# Patient Record
Sex: Female | Born: 1965 | Race: White | Hispanic: No | Marital: Married | Smoking: Never smoker
Health system: Southern US, Community
[De-identification: ages and names within clinical notes are randomized; demographics above are authoritative.]

## PROBLEM LIST (undated history)

## (undated) DIAGNOSIS — O009 Unspecified ectopic pregnancy without intrauterine pregnancy: Secondary | ICD-10-CM

## (undated) DIAGNOSIS — K635 Polyp of colon: Secondary | ICD-10-CM

## (undated) DIAGNOSIS — J45909 Unspecified asthma, uncomplicated: Secondary | ICD-10-CM

## (undated) DIAGNOSIS — K219 Gastro-esophageal reflux disease without esophagitis: Secondary | ICD-10-CM

## (undated) DIAGNOSIS — Z8041 Family history of malignant neoplasm of ovary: Secondary | ICD-10-CM

## (undated) DIAGNOSIS — K682 Retroperitoneal fibrosis: Secondary | ICD-10-CM

## (undated) DIAGNOSIS — N809 Endometriosis, unspecified: Secondary | ICD-10-CM

## (undated) DIAGNOSIS — T783XXA Angioneurotic edema, initial encounter: Secondary | ICD-10-CM

## (undated) DIAGNOSIS — N135 Crossing vessel and stricture of ureter without hydronephrosis: Secondary | ICD-10-CM

## (undated) DIAGNOSIS — Z803 Family history of malignant neoplasm of breast: Secondary | ICD-10-CM

## (undated) DIAGNOSIS — D126 Benign neoplasm of colon, unspecified: Secondary | ICD-10-CM

## (undated) DIAGNOSIS — K589 Irritable bowel syndrome without diarrhea: Secondary | ICD-10-CM

## (undated) HISTORY — PX: OTHER SURGICAL HISTORY: SHX169

## (undated) HISTORY — DX: Polyp of colon: K63.5

## (undated) HISTORY — PX: CHOLECYSTECTOMY: SHX55

## (undated) HISTORY — DX: Angioneurotic edema, initial encounter: T78.3XXA

## (undated) HISTORY — DX: Family history of malignant neoplasm of breast: Z80.3

## (undated) HISTORY — DX: Unspecified asthma, uncomplicated: J45.909

## (undated) HISTORY — DX: Family history of malignant neoplasm of ovary: Z80.41

## (undated) HISTORY — DX: Endometriosis, unspecified: N80.9

## (undated) HISTORY — PX: ABDOMINAL HYSTERECTOMY: SHX81

## (undated) HISTORY — PX: CHOLEDOCHOJEJUNOSTOMY: SHX1344

## (undated) HISTORY — DX: Benign neoplasm of colon, unspecified: D12.6

## (undated) HISTORY — PX: TONSILLECTOMY: SUR1361

## (undated) HISTORY — DX: Irritable bowel syndrome, unspecified: K58.9

## (undated) HISTORY — DX: Retroperitoneal fibrosis: K68.2

## (undated) HISTORY — PX: POLYPECTOMY: SHX149

## (undated) HISTORY — DX: Crossing vessel and stricture of ureter without hydronephrosis: N13.5

## (undated) HISTORY — DX: Unspecified ectopic pregnancy without intrauterine pregnancy: O00.90

## (undated) HISTORY — DX: Gastro-esophageal reflux disease without esophagitis: K21.9

## (undated) HISTORY — PX: APPENDECTOMY: SHX54

---

## 2011-09-09 DIAGNOSIS — R198 Other specified symptoms and signs involving the digestive system and abdomen: Secondary | ICD-10-CM | POA: Insufficient documentation

## 2019-01-13 DIAGNOSIS — N809 Endometriosis, unspecified: Secondary | ICD-10-CM | POA: Insufficient documentation

## 2020-05-16 DIAGNOSIS — Z91018 Allergy to other foods: Secondary | ICD-10-CM | POA: Insufficient documentation

## 2021-11-04 ENCOUNTER — Telehealth: Payer: Self-pay | Admitting: Oncology

## 2021-11-04 ENCOUNTER — Encounter: Payer: Self-pay | Admitting: Hematology and Oncology

## 2021-11-04 DIAGNOSIS — C562 Malignant neoplasm of left ovary: Secondary | ICD-10-CM | POA: Insufficient documentation

## 2021-11-04 NOTE — Telephone Encounter (Signed)
Lamarr Lulas and scheduled a new patient appointment with Dr. Alvy Bimler on 11/07/2021 at 8:00 with 7:30 arrival.  Also gave her my contact info to call with any questions. ?

## 2021-11-05 ENCOUNTER — Encounter: Payer: Self-pay | Admitting: Hematology and Oncology

## 2021-11-07 ENCOUNTER — Encounter: Payer: Self-pay | Admitting: Hematology and Oncology

## 2021-11-07 ENCOUNTER — Inpatient Hospital Stay: Payer: 59 | Attending: Hematology and Oncology | Admitting: Hematology and Oncology

## 2021-11-07 ENCOUNTER — Ambulatory Visit (HOSPITAL_COMMUNITY)
Admission: RE | Admit: 2021-11-07 | Discharge: 2021-11-07 | Disposition: A | Discharge status: Home / Self Care | Payer: Commercial Managed Care - PPO | Source: Ambulatory Visit | Attending: Hematology and Oncology | Admitting: Hematology and Oncology

## 2021-11-07 ENCOUNTER — Telehealth: Payer: Self-pay | Admitting: Hematology and Oncology

## 2021-11-07 ENCOUNTER — Encounter: Payer: Self-pay | Admitting: Oncology

## 2021-11-07 ENCOUNTER — Other Ambulatory Visit: Payer: Self-pay

## 2021-11-07 VITALS — BP 120/84 | HR 99 | Temp 98.4°F | Resp 18 | Ht 63.0 in | Wt 151.4 lb

## 2021-11-07 DIAGNOSIS — Z9221 Personal history of antineoplastic chemotherapy: Secondary | ICD-10-CM | POA: Insufficient documentation

## 2021-11-07 DIAGNOSIS — Z90710 Acquired absence of both cervix and uterus: Secondary | ICD-10-CM | POA: Insufficient documentation

## 2021-11-07 DIAGNOSIS — Z803 Family history of malignant neoplasm of breast: Secondary | ICD-10-CM | POA: Insufficient documentation

## 2021-11-07 DIAGNOSIS — C562 Malignant neoplasm of left ovary: Secondary | ICD-10-CM | POA: Diagnosis present

## 2021-11-07 DIAGNOSIS — B001 Herpesviral vesicular dermatitis: Secondary | ICD-10-CM | POA: Insufficient documentation

## 2021-11-07 DIAGNOSIS — T451X5A Adverse effect of antineoplastic and immunosuppressive drugs, initial encounter: Secondary | ICD-10-CM | POA: Insufficient documentation

## 2021-11-07 DIAGNOSIS — Z79899 Other long term (current) drug therapy: Secondary | ICD-10-CM | POA: Insufficient documentation

## 2021-11-07 DIAGNOSIS — K5909 Other constipation: Secondary | ICD-10-CM | POA: Diagnosis not present

## 2021-11-07 DIAGNOSIS — G620 Drug-induced polyneuropathy: Secondary | ICD-10-CM | POA: Diagnosis not present

## 2021-11-07 DIAGNOSIS — Z8041 Family history of malignant neoplasm of ovary: Secondary | ICD-10-CM | POA: Insufficient documentation

## 2021-11-07 DIAGNOSIS — K5900 Constipation, unspecified: Secondary | ICD-10-CM | POA: Insufficient documentation

## 2021-11-07 DIAGNOSIS — Z86010 Personal history of colonic polyps: Secondary | ICD-10-CM | POA: Insufficient documentation

## 2021-11-07 DIAGNOSIS — K219 Gastro-esophageal reflux disease without esophagitis: Secondary | ICD-10-CM | POA: Insufficient documentation

## 2021-11-07 MED ORDER — ONDANSETRON HCL 8 MG PO TABS: 8.0000 mg | ORAL_TABLET | Freq: Two times a day (BID) | ORAL | 1 refills | Status: DC | PRN

## 2021-11-07 MED ORDER — PROCHLORPERAZINE MALEATE 10 MG PO TABS: 10.0000 mg | ORAL_TABLET | Freq: Four times a day (QID) | ORAL | 1 refills | Status: DC | PRN

## 2021-11-07 MED ORDER — LIDOCAINE-PRILOCAINE 2.5-2.5 % EX CREA: TOPICAL_CREAM | CUTANEOUS | 3 refills | Status: DC

## 2021-11-07 MED ORDER — DEXAMETHASONE 4 MG PO TABS: ORAL_TABLET | ORAL | 6 refills | Status: DC

## 2021-11-07 MED ORDER — VALACYCLOVIR HCL 1 G PO TABS: 1000.0000 mg | ORAL_TABLET | Freq: Two times a day (BID) | ORAL | 0 refills | Status: DC

## 2021-11-07 NOTE — Assessment & Plan Note (Signed)
She had recent severe constipation exacerbated by side effects of antiemetics ?We have extensive discussions about the role of laxatives ?

## 2021-11-07 NOTE — Assessment & Plan Note (Signed)
She was prescribed gabapentin for other reasons ?For now, she will continue gabapentin at current dose ?We discussed potential dose modification if needed in the future ?

## 2021-11-07 NOTE — Progress Notes (Signed)
Met with Kristine White and provided her with the [employer].  Encouraged her to call with any questions or needs. ?

## 2021-11-07 NOTE — Assessment & Plan Note (Signed)
She has a mild breakout with herpes simplex likely related to stress from recent treatment ?I recommend a course of Valtrex ?She is in agreement ?

## 2021-11-07 NOTE — Assessment & Plan Note (Signed)
We have long discussions about plan of care ?She had received 1 cycle of chemotherapy at [facility] complicated by neuropathy, diffuse myalgias and arthralgias, constipation, hot flashes and breakout of cold sore ?We have extensive discussions about supportive care ?I will see her prior to cycle 2 of treatment ?I will order chest x-ray for position of her port ?I will schedule chemo education class ?I have given her prescription for cranial prosthesis ?The plan would be minimum 3 cycles of adjuvant treatment, potentially 6 cycles if she tolerated treatment well ?I will also schedule her to see genetic counselor ?We will try to get information from [facility] to see if genetic testing has been ordered ?I plan to see her back again in 2 weeks for further follow-up, prior to cycle 2 of treatment ?

## 2021-11-07 NOTE — Telephone Encounter (Signed)
Scheduled appointment per 3/23 scheduling message. Called to confirm infusion appointment on 4/4. Was waiting for an addon. Patient is aware. ?

## 2021-11-07 NOTE — Progress Notes (Signed)
[place] ?CONSULT NOTE ? ?Patient Care Team: ?Yevette Edwards, MD as PCP - General (Internal Medicine) ? 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[place]) ?We have long discussions about plan of care ?She had received 1 cycle of chemotherapy at [facility] complicated by neuropathy, diffuse myalgias and arthralgias, constipation, hot flashes and breakout of cold sore ?We have extensive discussions about supportive care ?I will see her prior to cycle 2 of treatment ?I will order chest x-ray for position of her port ?I will schedule chemo education class ?I have given her prescription for cranial prosthesis ?The plan would be minimum 3 cycles of adjuvant treatment, potentially 6 cycles if she tolerated treatment well ?I will also schedule her to see genetic counselor ?We will try to get information from [facility] to see if genetic testing has been ordered ?I plan to see her back again in 2 weeks for further follow-up, prior to cycle 2 of treatment ? ?Peripheral neuropathy due to chemotherapy [facility]) ?She was prescribed gabapentin for other reasons ?For now, she will continue gabapentin at current dose ?We discussed potential dose modification if needed in the future ? ?Cold sore ?She has a mild breakout with herpes simplex likely related to stress from recent treatment ?I recommend a course of Valtrex ?She is in agreement ? ?Other constipation ?She had recent severe constipation exacerbated by side effects of antiemetics ?We have extensive discussions about the role of laxatives ? ?Orders Placed This Encounter  ?Procedures  ? DG Chest 2 View  ?  Standing Status:   Future  ?  Number of Occurrences:   1  ?  Standing Expiration Date:   11/07/2022  ?  Order Specific Question:   Reason for Exam (SYMPTOM  OR DIAGNOSIS REQUIRED)  ?  Answer:   NEED TO CONFIRM PORT LOCATION  ?  Order Specific Question:   Is patient pregnant?  ?  Answer:   No  ?  Order Specific Question:   Preferred imaging location?  ?  Answer:    [facility]  ? CBC with Differential ([place] Only)  ?  Standing Status:   Standing  ?  Number of Occurrences:   20  ?  Standing Expiration Date:   11/08/2022  ? CMP ([place] only)  ?  Standing Status:   Standing  ?  Number of Occurrences:   20  ?  Standing Expiration Date:   11/08/2022  ? ? ?The total time spent in the appointment was 80 minutes encounter with patients including review of chart and various tests results, discussions about plan of care and coordination of care plan ? ? All questions were answered. The patient knows to call the clinic with any problems, questions or concerns. No barriers to learning was detected. ? ?Heath Lark, MD ?3/23/202312:15 PM ? ?CHIEF COMPLAINTS/PURPOSE OF CONSULTATION:  ?Ovarian cancer, for adjuvant treatment ? ?HISTORY OF PRESENTING ILLNESS:  ?Kristine White 56 y.o. female is here because of recent diagnosis of ovarian cancer ?The patient was referred here from [facility] to receive chemotherapy because this location is closer to home ?She has strong family history of breast and ovarian cancer ?She had extensive surgeries in the past and was being monitored when imaging study and of last year show abnormalities leading to further work-up and eventual surgery ?Since surgery, she has normal wound healing and is doing well ?She received cycle 1 of chemotherapy with carboplatin and paclitaxel on March 15 ?Her posttreatment course was complicated by  diffuse myalgias and arthralgias, neuropathy affecting her hands and feet, breakout of herpes simplex infection at the lower lip this morning, brief episode of rash that has resolved, hot flashes and constipation that has resolved.  She was taking Zofran round-the-clock after chemotherapy and it took approximately 1 week before she had bowel movement after chemo ? ?I have reviewed her chart and materials related to her cancer extensively and collaborated history with the patient. Summary of oncologic history is as  follows: ?Oncology History Overview Note  ?Mixed endometrioid and seromucinous ?  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 She has never undergone biopsy of the retroperitoneal tissue and its been historically activated to her multiple abdominal surgeries. MRI scan on 06/2020 showed fatty infiltration of the retroperitoneal space surrounding the aorta and IVC to the bifurcation with subcentimeter retroperitoneal nodes that appeared stable to marginally improved from prior CT, consistent with retroperitoneal fibrosis. At her most recent visit in November, she reported that she was overall doing well, continues to have chronic intermittent right upper quadrant abdominal pain. She had a repeat CT scan in November 2022 which showed an abnormal appearance of the left ovary with somewhat masslike appearance and ill-defined margins. She then had a follow-up ultrasound in November 2022 that showed a moderately lobulated mass within the left adnexa that was solid with mild peripheral and central vascularity, measuring about 4 cm, O-RADS 4, no findings of ascites. She has a history of endometriosis (s/p LSC ovarian cystectomies for several endometriomas and is s/p [facility] hysterectomy). ?  ?07/09/2021 Imaging  ? Ct abdomen and pelvis ?Abnormal appearance of the left ovary with somewhat mass like appearance and ill defined margins, possibly neoplasm.  Recommend pelvic ultrasound for further evaluation.  ? ?Infiltrative changes of the retroperitoneum, similar compared with previous examination, consistent with known retroperitoneal fibrosis   ?  ?09/19/2021 Pathology Results  ? A. Ovary and fallopian tube, left, salpingo-oophorectomy ?- Endometrioid adenocarcinoma, seromucinous type, FIGO grade 1, present as multiple foci of invasive carcinoma arising in a background of serousmucinous borderline tumor, see comment and synoptic  report ?-Ovarian surface involvement is identified ?-Left fallopian tube with focal endometriosis and focal foreign body giant cell reaction consistent with prior surgical intervention, no involvement by tumor identified ?  ?B.  Ovary and Fallopian tube, right, salpingo-oophorectomy ?-Serous cystadenoma, 1.7 cm, right ovary  ?-Background ovary with focal endometriosis and surface adhesions ?-Right fallopian tube with serosal adhesions ?-No involvement by tumor is identified ?  ?C.  Omental biopsy ?-Benign adipose tissue, no involvement by tumor identified ?  ?09/19/2021 Surgery  ? Procedures: Robotic assisted bilateral salpingo-oophorectomy (CPT [id number]), omental biopsy, bilateral ureterolysis (CPT [phone]), lysis of adhesions ? ?Surgeon: Cindie Laroche, MD ? ?Assistants: Bernadene Bell, MD - Fellow, Deeann Cree, MD - Resident ?Findings: On entry to abdomen, dense adhesions of the omentum to the anterior abdominal wall, unable to visualize upper abdomen, specifically unable to visualize the diaphragm, liver, stomach. Omentum grossly normal apart from adhesions. Entry site inspected with no trauma to surrounding structures. Bilateral complex ovarian masses, left approximately 4cm and right 3cm. Bilateral ovarian masses densely adherent to the pelvic sidewalls, and the right mass adherent to the mesentery of the rectum. Uterus and cervix surgically absent. Retroperitoneal fibrosis bilaterally requiring ureterolysis from the pelvic brim to the insertion into the bladder bilaterally. IOFS c/w borderline tumor.  ? ?Specimens:  ?ID Type Source Tests Collected by Time  Destination  ?1 : left tube and ovary Tissue Ovary, Left SURGICAL PATHOLOGY EXAM Bernadene Bell, MD 09/19/2021 [phone]  ?2 : pelvic washing Washing Pelvic cavity CYTOLOGY - EXAM FROM OPTIME Christella Hartigan, MD 09/19/2021 [phone]  ?3 : right ovary and tube Tissue Ovary, Right SURGICAL PATHOLOGY EXAM Christella Hartigan, MD 09/19/2021 1012  ?4 : omental biopsy Tissue  Omentum SURGICAL PATHOLOGY EXAM Christella Hartigan, MD 09/19/2021 1012  ?  ?10/29/2021 Procedure  ? Successful placement of a single lumen Power port catheter in the right internal jugular vein under ultrasound and

## 2021-11-07 NOTE — Progress Notes (Signed)
START ON PATHWAY REGIMEN - Ovarian ? ? ?  A cycle is every 21 days: ?    Paclitaxel  ?    Carboplatin  ? ?**Always confirm dose/schedule in your pharmacy ordering system** ? ?Patient Characteristics: ?Postoperative without Neoadjuvant Therapy (Pathologic Staging), Newly Diagnosed, Adjuvant Therapy, Stage IC, Grade 1 or 2 ?BRCA Mutation Status: Awaiting Test Results ?Therapeutic Status: Postoperative without Neoadjuvant Therapy (Pathologic Staging) ?AJCC 8 Stage Grouping: IC ?AJCC M Category: cM0 ?AJCC T Category: pT1c ?AJCC N Category: pN0 ?Tumor Grade: 1 ?Intent of Therapy: ?Curative Intent, Discussed with Patient ?

## 2021-11-08 ENCOUNTER — Telehealth: Payer: Self-pay | Admitting: Oncology

## 2021-11-08 NOTE — Telephone Encounter (Signed)
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  She has never taken Heparin and was told not to take it due to Alpha Gal. ?

## 2021-11-11 ENCOUNTER — Telehealth: Payer: Self-pay | Admitting: Oncology

## 2021-11-11 ENCOUNTER — Encounter: Payer: Self-pay | Admitting: Hematology and Oncology

## 2021-11-11 ENCOUNTER — Inpatient Hospital Stay: Payer: 59

## 2021-11-11 ENCOUNTER — Other Ambulatory Visit: Payer: Self-pay | Admitting: Hematology and Oncology

## 2021-11-11 DIAGNOSIS — R21 Rash and other nonspecific skin eruption: Secondary | ICD-10-CM | POA: Insufficient documentation

## 2021-11-11 MED ORDER — PREDNISONE 20 MG PO TABS: 40.0000 mg | ORAL_TABLET | Freq: Every day | ORAL | 0 refills | Status: DC

## 2021-11-11 NOTE — Telephone Encounter (Signed)
Called Kristine White and advised her of note from Dr. Alvy Bimler.  Scheduled her for tomorrow at 9:20.   ? ?Also discussed using prednisone.  She said she does not have any on hand but usually uses Allegra and Pepcid BID.  Sometimes she will use a prednisone taper but does not have it on hand.  [facility] [address] in [place].   ?

## 2021-11-11 NOTE — Telephone Encounter (Signed)
Left a message advising that [facility]. ?

## 2021-11-11 NOTE — Telephone Encounter (Signed)
Kristine White called and rescheduled her chemo education to Thursday because she is not feeling well and has no energy today.  She said she has a raised rash that started yesterday.  The rash is on her feet, legs, arms and buttocks.  It is not on her trunk or face.  She said it is itchy on her feet and knees.  Her thighs have some scabbed areas.  It is hard from her to walk with the rash on her feet.  She is wondering if this is from chemotherapy - last treatment was 10/30/21 at [facility] or she also has a history of angioedema.  She is going to try to send pictures through [place]. ?

## 2021-11-11 NOTE — Telephone Encounter (Signed)
[facility]

## 2021-11-11 NOTE — Telephone Encounter (Signed)
This is not typical side-effects from chemo or angioedema ?I have no opening to see her today but I can try to add her on tomorrow at 1pm ?

## 2021-11-12 ENCOUNTER — Other Ambulatory Visit: Payer: Self-pay

## 2021-11-12 ENCOUNTER — Inpatient Hospital Stay (HOSPITAL_BASED_OUTPATIENT_CLINIC_OR_DEPARTMENT_OTHER): Payer: 59 | Admitting: Hematology and Oncology

## 2021-11-12 ENCOUNTER — Encounter: Payer: Self-pay | Admitting: Hematology and Oncology

## 2021-11-12 DIAGNOSIS — G620 Drug-induced polyneuropathy: Secondary | ICD-10-CM | POA: Diagnosis not present

## 2021-11-12 DIAGNOSIS — Z90710 Acquired absence of both cervix and uterus: Secondary | ICD-10-CM | POA: Diagnosis not present

## 2021-11-12 DIAGNOSIS — T451X5A Adverse effect of antineoplastic and immunosuppressive drugs, initial encounter: Secondary | ICD-10-CM | POA: Diagnosis not present

## 2021-11-12 DIAGNOSIS — Z79899 Other long term (current) drug therapy: Secondary | ICD-10-CM | POA: Diagnosis not present

## 2021-11-12 DIAGNOSIS — C562 Malignant neoplasm of left ovary: Secondary | ICD-10-CM

## 2021-11-12 DIAGNOSIS — Z803 Family history of malignant neoplasm of breast: Secondary | ICD-10-CM | POA: Diagnosis not present

## 2021-11-12 DIAGNOSIS — Z9221 Personal history of antineoplastic chemotherapy: Secondary | ICD-10-CM | POA: Diagnosis not present

## 2021-11-12 DIAGNOSIS — R21 Rash and other nonspecific skin eruption: Secondary | ICD-10-CM

## 2021-11-12 DIAGNOSIS — Z8041 Family history of malignant neoplasm of ovary: Secondary | ICD-10-CM | POA: Diagnosis not present

## 2021-11-12 DIAGNOSIS — B001 Herpesviral vesicular dermatitis: Secondary | ICD-10-CM | POA: Diagnosis not present

## 2021-11-12 DIAGNOSIS — K5900 Constipation, unspecified: Secondary | ICD-10-CM | POA: Diagnosis not present

## 2021-11-12 DIAGNOSIS — Z86010 Personal history of colonic polyps: Secondary | ICD-10-CM | POA: Diagnosis not present

## 2021-11-12 DIAGNOSIS — K219 Gastro-esophageal reflux disease without esophagitis: Secondary | ICD-10-CM | POA: Diagnosis not present

## 2021-11-12 NOTE — Progress Notes (Signed)
Pharmacist Chemotherapy Monitoring - Initial Assessment   ? ?Anticipated start date: 11/19/21  ? ?The following has been reviewed per standard work regarding the patient's treatment regimen: ?The patient's diagnosis, treatment plan and drug doses, and organ/hematologic function ?Lab orders and baseline tests specific to treatment regimen  ?The treatment plan start date, drug sequencing, and pre-medications ?Prior authorization status  ?Patient's documented medication list, including drug-drug interaction screen and prescriptions for anti-emetics and supportive care specific to the treatment regimen ?The drug concentrations, fluid compatibility, administration routes, and timing of the medications to be used ?The patient's access for treatment and lifetime cumulative dose history, if applicable  ?The patient's medication allergies and previous infusion related reactions, if applicable  ? ?Changes made to treatment plan:  ?N/A ? ?Follow up needed:  ?Pending authorization for treatment  ? ? ?Romualdo Bolk [place], [place], ?11/12/2021  11:14 AM  ?

## 2021-11-12 NOTE — Assessment & Plan Note (Signed)
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

## 2021-11-12 NOTE — Assessment & Plan Note (Signed)
She has developed some hives not related to chemotherapy ?We discussed the role of genetics referral ?

## 2021-11-12 NOTE — Progress Notes (Signed)
[place] ?OFFICE PROGRESS NOTE ? ?Patient Care Team: ?Kristine Edwards, MD as PCP - General (Internal Medicine) ? 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 ?Cold sore ?This is resolved ?We will discontinue Valtrex ? 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 ?No orders of the defined types were placed in this encounter. ? ? ?All questions were answered. The patient knows to call the clinic with any problems, questions or concerns. ?The total time spent in the appointment was 20 minutes encounter with patients including review of chart and various tests results, discussions about plan of care and coordination of care plan ?  ?Heath Lark, MD ?11/12/2021 10:06 AM ? ?INTERVAL HISTORY: ?Please see below for problem oriented charting. ?she returns for urgent evaluation due to hives ?The hives is throughout except for her torso ?No skin peeling or blister formation ?She felt slightly better since we started her on prednisone ? ?REVIEW OF SYSTEMS:   ?Constitutional: Denies fevers, chills or abnormal weight loss ?Eyes: Denies blurriness of vision ?Ears, nose, mouth, throat, and face: Denies mucositis or sore throat ?Respiratory: Denies cough, dyspnea or wheezes ?Cardiovascular: Denies palpitation, chest discomfort or lower extremity swelling ?Gastrointestinal:  Denies nausea, heartburn or change in bowel habits ?Lymphatics: Denies new lymphadenopathy or easy  bruising ?Neurological:Denies numbness, tingling or new weaknesses ?Behavioral/Psych: Mood is stable, no new changes  ?All other systems were reviewed with the patient and are negative. ? ?I have reviewed the past medical history, past surgical history, social history and family history with the patient and they are unchanged from previous note. ? ?ALLERGIES:  is allergic to heparin. ? ?MEDICATIONS:  ?Current Outpatient Medications  ?Medication Sig Dispense Refill  ? acetaminophen (TYLENOL) 325 MG tablet Take 325 mg by mouth every 6 (six) hours as needed.    ? Cholecalciferol 50 MCG (2000 UT) CAPS Take 2,000 Units by mouth daily.    ? dexamethasone (DECADRON) 4 MG tablet Take 2 tabs at the night before and 2 tab the morning of chemotherapy, every 3 weeks, by mouth x 5 cycles 20 tablet 6  ? EPINEPHrine 0.3 mg/0.3 mL IJ SOAJ injection Inject into the muscle.    ? gabapentin (NEURONTIN) 100 MG capsule Take by mouth.    ? lidocaine-prilocaine (EMLA) cream Apply topically.    ? ondansetron (ZOFRAN) 8 MG tablet Take 1 tablet (8 mg total) by mouth 2 (two) times daily as needed for refractory nausea / vomiting. Start on day 3 after carboplatin chemo. 30 tablet 1  ? predniSONE (DELTASONE) 20 MG tablet Take 2 tablets (40 mg total) by mouth daily with breakfast. 14 tablet 0  ? prochlorperazine (COMPAZINE) 10 MG tablet Take 1 tablet (10 mg total) by mouth every 6 (six) hours as needed (Nausea or vomiting). 30 tablet 1  ? ?No current facility-administered medications for this visit.  ? ? ?SUMMARY OF ONCOLOGIC HISTORY: ?Oncology History Overview Note  ?Mixed endometrioid and seromucinous ?  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 She has never undergone biopsy of the retroperitoneal tissue and its been historically activated to her multiple abdominal surgeries. MRI scan on 06/2020 showed fatty infiltration of the  retroperitoneal space surrounding the aorta and IVC to the bifurcation with subcentimeter retroperitoneal nodes that appeared stable to marginally improved from prior CT, consistent with retroperitoneal fibrosis. At her most recent visit in November, she reported that she was overall doing well, continues to have chronic intermittent right upper quadrant abdominal pain. She had a repeat CT scan in November 2022 which showed an abnormal appearance of the left ovary with somewhat masslike appearance and ill-defined margins. She then had a follow-up ultrasound in November 2022 that showed a moderately lobulated mass within the left adnexa that was solid with mild peripheral and central vascularity, measuring about 4 cm, O-RADS 4, no findings of ascites. She has a history of endometriosis (s/p LSC ovarian cystectomies for several endometriomas and is s/p [facility] hysterectomy). ?  ?07/09/2021 Imaging  ? Ct abdomen and pelvis ?Abnormal appearance of the left ovary with somewhat mass like appearance and ill defined margins, possibly neoplasm.  Recommend pelvic ultrasound for further evaluation.  ? ?Infiltrative changes of the retroperitoneum, similar compared with previous examination, consistent with known retroperitoneal fibrosis   ?  ?09/19/2021 Pathology Results  ? A. Ovary and fallopian tube, left, salpingo-oophorectomy ?- Endometrioid adenocarcinoma, seromucinous type, FIGO grade 1, present as multiple foci of invasive carcinoma arising in a background of serousmucinous borderline tumor, see comment and synoptic report ?-Ovarian surface involvement is identified ?-Left fallopian tube with focal endometriosis and focal foreign body giant cell reaction consistent with prior surgical intervention, no involvement by tumor identified ?  ?B.  Ovary and Fallopian tube, right, salpingo-oophorectomy ?-Serous cystadenoma, 1.7 cm, right ovary  ?-Background ovary with focal endometriosis and surface adhesions ?-Right fallopian tube  with serosal adhesions ?-No involvement by tumor is identified ?  ?C.  Omental biopsy ?-Benign adipose tissue, no involvement by tumor identified ?  ?09/19/2021 Surgery  ? Procedures: Robotic assisted bilateral salpingo-oophorectomy (CPT P4008117), omental biopsy, bilateral ureterolysis (CPT [phone]), lysis of adhesions ? ?Surgeon: Cindie Laroche, MD ? ?Assistants: Bernadene Bell, MD - Fellow, Deeann Cree, MD - Resident ?Findings: On entry to abdomen, dense adhesions of the omentum to the anterior abdominal wall, unable to visualize upper abdomen, specifically unable to visualize the diaphragm, liver, stomach. Omentum grossly normal apart from adhesions. Entry site inspected with no trauma to surrounding structures. Bilateral complex ovarian masses, left approximately 4cm and right 3cm. Bilateral ovarian masses densely adherent to the pelvic sidewalls, and the right mass adherent to the mesentery of the rectum. Uterus and cervix surgically absent. Retroperitoneal fibrosis bilaterally requiring ureterolysis from the pelvic brim to the insertion into the bladder bilaterally. IOFS c/w borderline tumor.  ? ?Specimens:  ?ID Type Source Tests Collected by Time Destination  ?1 : left tube and ovary Tissue Ovary, Left SURGICAL PATHOLOGY EXAM Bernadene Bell, MD 09/19/2021 [phone]  ?2 : pelvic washing Washing Pelvic cavity CYTOLOGY - EXAM FROM OPTIME Christella Hartigan, MD 09/19/2021 [phone]  ?3 : right ovary and tube Tissue Ovary, Right SURGICAL PATHOLOGY EXAM Christella Hartigan, MD 09/19/2021 1012  ?4 : omental biopsy Tissue Omentum SURGICAL PATHOLOGY EXAM Christella Hartigan, MD 09/19/2021 1012  ?  ?10/29/2021 Procedure  ? Successful placement of a single lumen Power port catheter in the right internal jugular vein under ultrasound and fluoroscopy at [facility] ?  ?10/30/2021 -  Chemotherapy  ?  She received first cycle of carboplatin and taxol at [facility] ?  ?11/04/2021 Initial Diagnosis  ? Left ovarian epithelial cancer [facility]) ?  ?11/04/2021 Cancer  Staging  ? Staging form: Ovary, Fallopian Tube, and Primary Peritoneal Carcinoma, AJCC 8th Edition ?- Pathologic stage from 11/04/2021: FIGO Stage IC2, calculated as Stage IC (pT1c2, pN0, cM0) - Signed by Heath Lark, MD on 11/04/2021 ?S

## 2021-11-12 NOTE — Assessment & Plan Note (Signed)
This is resolved ?We will discontinue Valtrex ?

## 2021-11-14 ENCOUNTER — Inpatient Hospital Stay: Payer: 59

## 2021-11-14 ENCOUNTER — Other Ambulatory Visit: Payer: Self-pay

## 2021-11-18 ENCOUNTER — Encounter: Payer: Self-pay | Admitting: Hematology and Oncology

## 2021-11-18 NOTE — Progress Notes (Signed)
Called pt to introduce myself as her Financial Resource Specialist and to discuss the [employer] grant.  Pt has 2 insurances so copay assistance shouldn't be needed.  I left a msg requesting she return my call if she's interested in applying for the grant.  

## 2021-11-19 ENCOUNTER — Inpatient Hospital Stay: Payer: 59 | Attending: Hematology and Oncology

## 2021-11-19 ENCOUNTER — Encounter: Payer: Self-pay | Admitting: Hematology and Oncology

## 2021-11-19 ENCOUNTER — Inpatient Hospital Stay: Payer: 59

## 2021-11-19 ENCOUNTER — Other Ambulatory Visit: Payer: Self-pay

## 2021-11-19 ENCOUNTER — Inpatient Hospital Stay (HOSPITAL_BASED_OUTPATIENT_CLINIC_OR_DEPARTMENT_OTHER): Payer: 59 | Admitting: Hematology and Oncology

## 2021-11-19 VITALS — BP 109/76 | HR 65 | Temp 98.4°F | Resp 18

## 2021-11-19 DIAGNOSIS — C562 Malignant neoplasm of left ovary: Secondary | ICD-10-CM | POA: Insufficient documentation

## 2021-11-19 DIAGNOSIS — Z5111 Encounter for antineoplastic chemotherapy: Secondary | ICD-10-CM | POA: Diagnosis not present

## 2021-11-19 DIAGNOSIS — G620 Drug-induced polyneuropathy: Secondary | ICD-10-CM

## 2021-11-19 DIAGNOSIS — Z7952 Long term (current) use of systemic steroids: Secondary | ICD-10-CM | POA: Insufficient documentation

## 2021-11-19 DIAGNOSIS — T451X5A Adverse effect of antineoplastic and immunosuppressive drugs, initial encounter: Secondary | ICD-10-CM

## 2021-11-19 DIAGNOSIS — K5909 Other constipation: Secondary | ICD-10-CM | POA: Insufficient documentation

## 2021-11-19 LAB — CBC WITH DIFFERENTIAL (CANCER CENTER ONLY)
Abs Immature Granulocytes: 0.07 10*3/uL (ref 0.00–0.07)
Basophils Absolute: 0 10*3/uL (ref 0.0–0.1)
Basophils Relative: 0 %
Eosinophils Absolute: 0 10*3/uL (ref 0.0–0.5)
Eosinophils Relative: 0 %
HCT: 36.6 % (ref 36.0–46.0)
Hemoglobin: 12.1 g/dL (ref 12.0–15.0)
Immature Granulocytes: 1 %
Lymphocytes Relative: 10 %
Lymphs Abs: 0.9 10*3/uL (ref 0.7–4.0)
MCH: 28.9 pg (ref 26.0–34.0)
MCHC: 33.1 g/dL (ref 30.0–36.0)
MCV: 87.4 fL (ref 80.0–100.0)
Monocytes Absolute: 0 10*3/uL — ABNORMAL LOW (ref 0.1–1.0)
Monocytes Relative: 0 %
Neutro Abs: 8 10*3/uL — ABNORMAL HIGH (ref 1.7–7.7)
Neutrophils Relative %: 89 %
Platelet Count: 210 10*3/uL (ref 150–400)
RBC: 4.19 MIL/uL (ref 3.87–5.11)
RDW: 13.1 % (ref 11.5–15.5)
WBC Count: 8.9 10*3/uL (ref 4.0–10.5)
nRBC: 0 % (ref 0.0–0.2)

## 2021-11-19 LAB — CMP (CANCER CENTER ONLY)
ALT: 54 U/L — ABNORMAL HIGH (ref 0–44)
AST: 21 U/L (ref 15–41)
Albumin: 4.4 g/dL (ref 3.5–5.0)
Alkaline Phosphatase: 75 U/L (ref 38–126)
Anion gap: 10 (ref 5–15)
BUN: 17 mg/dL (ref 6–20)
CO2: 22 mmol/L (ref 22–32)
Calcium: 9.2 mg/dL (ref 8.9–10.3)
Chloride: 108 mmol/L (ref 98–111)
Creatinine: 0.8 mg/dL (ref 0.44–1.00)
GFR, Estimated: >60 mL/min (ref >60)
Glucose, Bld: 214 mg/dL — ABNORMAL HIGH (ref 70–99)
Potassium: 4.1 mmol/L (ref 3.5–5.1)
Sodium: 140 mmol/L (ref 135–145)
Total Bilirubin: 0.3 mg/dL (ref 0.3–1.2)
Total Protein: 7.2 g/dL (ref 6.5–8.1)

## 2021-11-19 MED ORDER — SODIUM CHLORIDE 0.9 % IV SOLN
175.0000 mg/m2 | Freq: Once | INTRAVENOUS | Status: AC
  Administered 2021-11-19: 306 mg via INTRAVENOUS
  Filled 2021-11-19: qty 51

## 2021-11-19 MED ORDER — SODIUM CHLORIDE 0.9% FLUSH
10.0000 mL | INTRAVENOUS | Status: DC | PRN
  Administered 2021-11-19: 10 mL

## 2021-11-19 MED ORDER — FAMOTIDINE IN NACL 20-0.9 MG/50ML-% IV SOLN
20.0000 mg | Freq: Once | INTRAVENOUS | Status: AC
  Administered 2021-11-19: 20 mg via INTRAVENOUS
  Filled 2021-11-19: qty 50

## 2021-11-19 MED ORDER — SODIUM CHLORIDE 0.9 % IV SOLN: Freq: Once | INTRAVENOUS | Status: AC

## 2021-11-19 MED ORDER — PALONOSETRON HCL INJECTION 0.25 MG/5ML
0.2500 mg | Freq: Once | INTRAVENOUS | Status: AC
  Administered 2021-11-19: 0.25 mg via INTRAVENOUS
  Filled 2021-11-19: qty 5

## 2021-11-19 MED ORDER — SODIUM CHLORIDE 0.9 % IV SOLN
690.0000 mg | Freq: Once | INTRAVENOUS | Status: AC
  Administered 2021-11-19: 690 mg via INTRAVENOUS
  Filled 2021-11-19: qty 69

## 2021-11-19 MED ORDER — DIPHENHYDRAMINE HCL 50 MG/ML IJ SOLN
25.0000 mg | Freq: Once | INTRAMUSCULAR | Status: AC
  Administered 2021-11-19: 25 mg via INTRAVENOUS
  Filled 2021-11-19: qty 1

## 2021-11-19 MED ORDER — SODIUM CHLORIDE 0.9 % IV SOLN
150.0000 mg | Freq: Once | INTRAVENOUS | Status: AC
  Administered 2021-11-19: 150 mg via INTRAVENOUS
  Filled 2021-11-19: qty 150

## 2021-11-19 MED ORDER — SODIUM CHLORIDE 0.9% FLUSH
10.0000 mL | Freq: Once | INTRAVENOUS | Status: AC
  Administered 2021-11-19: 10 mL

## 2021-11-19 MED ORDER — SODIUM CHLORIDE 0.9 % IV SOLN
10.0000 mg | Freq: Once | INTRAVENOUS | Status: AC
  Administered 2021-11-19: 10 mg via INTRAVENOUS
  Filled 2021-11-19: qty 10

## 2021-11-19 NOTE — Assessment & Plan Note (Signed)
We discussed importance of laxative therapy 

## 2021-11-19 NOTE — Assessment & Plan Note (Signed)
This is not severe ?We will observe closely ?We discussed importance of trial of ice/cryotherapy during treatment ?

## 2021-11-19 NOTE — Patient Instructions (Signed)
[facility] MEDICAL [facility]COLOG[facility]  Discharge Instructions: Thank you for choosing [facility] to provide your oncology and hematology care.   If you have a lab appointment with the Cancer Center, please go directly to the Cancer Center and check in at the registration area.   Wear comfortable clothing and clothing appropriate for easy access to any Portacath or PICC line.   We strive to give you quality time with your provider. You may need to reschedule your appointment if you arrive late (15 or more minutes).  Arriving late affects you and other patients whose appointments are after yours.  Also, if you miss three or more appointments without notifying the office, you may be dismissed from the clinic at the provider's discretion.      For prescription refill requests, have your pharmacy contact our office and allow 72 hours for refills to be completed.    Today you received the following chemotherapy and/or immunotherapy agents: Taxol & Carboplatin   To help prevent nausea and vomiting after your treatment, we encourage you to take your nausea medication as directed.  BELOW ARE SYMPTOMS THAT SHOULD BE REPORTED IMMEDIATELY: *FEVER GREATER THAN 100.4 F (38 C) OR HIGHER *CHILLS OR SWEATING *NAUSEA AND VOMITING THAT IS NOT CONTROLLED WITH YOUR NAUSEA MEDICATION *UNUSUAL SHORTNESS OF BREATH *UNUSUAL BRUISING OR BLEEDING *URINARY PROBLEMS (pain or burning when urinating, or frequent urination) *BOWEL PROBLEMS (unusual diarrhea, constipation, pain near the anus) TENDERNESS IN MOUTH AND THROAT WITH OR WITHOUT PRESENCE OF ULCERS (sore throat, sores in mouth, or a toothache) UNUSUAL RASH, SWELLING OR PAIN  UNUSUAL VAGINAL DISCHARGE OR ITCHING   Items with * indicate a potential emergency and should be followed up as soon as possible or go to the Emergency Department if any problems should occur.  Please show the CHEMOTHERAPY ALERT CARD or IMMUNOTHERAPY ALERT CARD at  check-in to the Emergency Department and triage nurse.  Should you have questions after your visit or need to cancel or reschedule your appointment, please contact [facility]  Dept: [phone]  and follow the prompts.  Office hours are 8:00 a.m. to 4:30 p.m. Monday - Friday. Please note that voicemails left after 4:00 p.m. may not be returned until the following business day.  We are closed weekends and major holidays. You have access to a nurse at all times for urgent questions. Please call the main number to the clinic Dept: [phone] and follow the prompts.   For any non-urgent questions, you may also contact your provider using MyChart. We now offer e-Visits for anyone 18 and older to request care online for non-urgent symptoms. For details visit [email or web address].   Also download the MyChart app! Go to the app store, search "MyChart", open the app, select [facility], and log in with your MyChart username and password.  Due to Covid, a mask is required upon entering the hospital/clinic. If you do not have a mask, one will be given to you upon arrival. For doctor visits, patients may have 1 support person aged 18 or older with them. For treatment visits, patients cannot have anyone with them due to current Covid guidelines and our immunocompromised population.   

## 2021-11-19 NOTE — Assessment & Plan Note (Signed)
She has developed some hives not related to chemotherapy ?We discussed the role of genetics referral ?We will proceed with treatment as scheduled ?We discussed the role of cryotherapy and the importance of laxative to avoid constipation ?

## 2021-11-19 NOTE — Progress Notes (Signed)
[facility] ?OFFICE PROGRESS NOTE ? ?Patient Care Team: ?Yevette Edwards, MD as PCP - General (Internal Medicine) ? 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 ?Peripheral neuropathy due to chemotherapy [facility]) ?This is not severe ?We will observe closely ?We discussed importance of trial of ice/cryotherapy during treatment ? ?Other constipation ?We discussed importance of laxative therapy ? ?No orders of the defined types were placed in this encounter. ? ? ?All questions were answered. The patient knows to call the clinic with any problems, questions or concerns. ?The total time spent in the appointment was 25 minutes encounter with patients including review of chart and various tests results, discussions about plan of care and coordination of care plan ?  ?Heath Lark, MD ?11/19/2021 2:55 PM ? ?INTERVAL HISTORY: ?Please see below for problem oriented charting. ?she returns for treatment follow-up seen prior to cycle 2 of treatment ?Her hives has resolved ?She denies further constipation ?She has minimal residual neuropathy from treatment ? ?REVIEW OF SYSTEMS:   ?Constitutional: Denies fevers, chills or abnormal weight loss ?Eyes: Denies blurriness of vision ?Ears, nose, mouth, throat, and face: Denies mucositis or sore throat ?Respiratory: Denies cough, dyspnea or wheezes ?Cardiovascular: Denies palpitation, chest discomfort or lower extremity swelling ?Gastrointestinal:  Denies nausea, heartburn or change in bowel habits ?Skin: Denies abnormal skin rashes ?Lymphatics: Denies new lymphadenopathy or easy bruising ?Behavioral/Psych: Mood is stable, no new changes  ?All other systems were reviewed with the patient and are negative. ? ?I have reviewed the  past medical history, past surgical history, social history and family history with the patient and they are unchanged from previous note. ? ?ALLERGIES:  is allergic to heparin. ? ?MEDICATIONS:  ?Current Outpatient Medications  ?Medication Sig Dispense Refill  ? acetaminophen (TYLENOL) 325 MG tablet Take 325 mg by mouth every 6 (six) hours as needed.    ? Cholecalciferol 50 MCG (2000 UT) CAPS Take 2,000 Units by mouth daily.    ? dexamethasone (DECADRON) 4 MG tablet Take 2 tabs at the night before and 2 tab the morning of chemotherapy, every 3 weeks, by mouth x 5 cycles 20 tablet 6  ? EPINEPHrine 0.3 mg/0.3 mL IJ SOAJ injection Inject into the muscle.    ? gabapentin (NEURONTIN) 100 MG capsule Take by mouth.    ? lidocaine-prilocaine (EMLA) cream Apply topically.    ? ondansetron (ZOFRAN) 8 MG tablet Take 1 tablet (8 mg total) by mouth 2 (two) times daily as needed for refractory nausea / vomiting. Start on day 3 after carboplatin chemo. 30 tablet 1  ? prochlorperazine (COMPAZINE) 10 MG tablet Take 1 tablet (10 mg total) by mouth every 6 (six) hours as needed (Nausea or vomiting). 30 tablet 1  ? ?No current facility-administered medications for this visit.  ? ?Facility-Administered Medications Ordered in Other Visits  ?Medication Dose Route Frequency Provider Last Rate Last Admin  ? CARBOplatin (PARAPLATIN) 690 mg in sodium chloride 0.9 % 250 mL chemo infusion  690 mg Intravenous Once Alvy Bimler, Kenna Seward, MD      ? sodium chloride flush (NS) 0.9 % injection 10 mL  10 mL Intracatheter PRN Heath Lark, MD      ? ? ?SUMMARY OF ONCOLOGIC HISTORY: ?Oncology History Overview Note  ?Mixed endometrioid and seromucinous ?  ?  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 She has never undergone biopsy of the retroperitoneal tissue and its been historically activated to her multiple abdominal surgeries. MRI scan on 06/2020 showed fatty  infiltration of the retroperitoneal space surrounding the aorta and IVC to the bifurcation with subcentimeter retroperitoneal nodes that appeared stable to marginally improved from prior CT, consistent with retroperitoneal fibrosis. At her most recent visit in November, she reported that she was overall doing well, continues to have chronic intermittent right upper quadrant abdominal pain. She had a repeat CT scan in November 2022 which showed an abnormal appearance of the left ovary with somewhat masslike appearance and ill-defined margins. She then had a follow-up ultrasound in November 2022 that showed a moderately lobulated mass within the left adnexa that was solid with mild peripheral and central vascularity, measuring about 4 cm, O-RADS 4, no findings of ascites. She has a history of endometriosis (s/p LSC ovarian cystectomies for several endometriomas and is s/p [facility] hysterectomy). ?  ?07/09/2021 Imaging  ? Ct abdomen and pelvis ?Abnormal appearance of the left ovary with somewhat mass like appearance and ill defined margins, possibly neoplasm.  Recommend pelvic ultrasound for further evaluation.  ? ?Infiltrative changes of the retroperitoneum, similar compared with previous examination, consistent with known retroperitoneal fibrosis   ?  ?09/19/2021 Pathology Results  ? A. Ovary and fallopian tube, left, salpingo-oophorectomy ?- Endometrioid adenocarcinoma, seromucinous type, FIGO grade 1, present as multiple foci of invasive carcinoma arising in a background of serousmucinous borderline tumor, see comment and synoptic report ?-Ovarian surface involvement is identified ?-Left fallopian tube with focal endometriosis and focal foreign body giant cell reaction consistent with prior surgical intervention, no involvement by tumor identified ?  ?B.  Ovary and Fallopian tube, right, salpingo-oophorectomy ?-Serous cystadenoma, 1.7 cm, right ovary  ?-Background ovary with focal endometriosis and surface  adhesions ?-Right fallopian tube with serosal adhesions ?-No involvement by tumor is identified ?  ?C.  Omental biopsy ?-Benign adipose tissue, no involvement by tumor identified ?  ?09/19/2021 Surgery  ? Procedures: Robotic assisted bilateral salpingo-oophorectomy (CPT [id number]), omental biopsy, bilateral ureterolysis (CPT [phone]), lysis of adhesions ? ?Surgeon: Cindie Laroche, MD ? ?Assistants: Bernadene Bell, MD - Fellow, Deeann Cree, MD - Resident ?Findings: On entry to abdomen, dense adhesions of the omentum to the anterior abdominal wall, unable to visualize upper abdomen, specifically unable to visualize the diaphragm, liver, stomach. Omentum grossly normal apart from adhesions. Entry site inspected with no trauma to surrounding structures. Bilateral complex ovarian masses, left approximately 4cm and right 3cm. Bilateral ovarian masses densely adherent to the pelvic sidewalls, and the right mass adherent to the mesentery of the rectum. Uterus and cervix surgically absent. Retroperitoneal fibrosis bilaterally requiring ureterolysis from the pelvic brim to the insertion into the bladder bilaterally. IOFS c/w borderline tumor.  ? ?Specimens:  ?ID Type Source Tests Collected by Time Destination  ?1 : left tube and ovary Tissue Ovary, Left SURGICAL PATHOLOGY EXAM Bernadene Bell, MD 09/19/2021 [phone]  ?2 : pelvic washing Washing Pelvic cavity CYTOLOGY - EXAM FROM OPTIME Christella Hartigan, MD 09/19/2021 [phone]  ?3 : right ovary and tube Tissue Ovary, Right SURGICAL PATHOLOGY EXAM Christella Hartigan, MD 09/19/2021 1012  ?4 : omental biopsy Tissue Omentum SURGICAL PATHOLOGY EXAM Christella Hartigan, MD 09/19/2021 1012  ?  ?10/29/2021 Procedure  ? Successful placement of a single lumen Power port catheter in the right internal  jugular vein under ultrasound and fluoroscopy at [facility] ?  ?10/30/2021 -  Chemotherapy  ? She received first cycle of carboplatin and taxol at [facility] ?  ?11/04/2021 Initial Diagnosis  ? Left ovarian epithelial  cancer [facility]) ?  ?11/04/2021 Cancer Staging  ? Staging form: Ovary, Fallopian Tube, and Primary Peritoneal Carcinoma, AJCC 8th Edition ?- Pathologic stage from 11/04/2021: FIGO Stage IC2, calculated as Stage IC (pT1c2, pN0,

## 2021-11-20 ENCOUNTER — Telehealth: Payer: Self-pay | Admitting: *Deleted

## 2021-11-20 NOTE — Telephone Encounter (Signed)
-----   Message from Anastasia Pall, RN sent at 11/19/2021  3:43 PM EDT ----- ?Regarding: 1st time Taxol/Carbo @ [facility], being seen by Dr. Alvy Bimler ?Patient received [facility]. This was her 1st time receiving the infusion here at the [facility] and was treated as a first time. Please check on her tomorrow, thank you.  ? ?

## 2021-11-20 NOTE — Telephone Encounter (Signed)
LM on pt's VM to call back to let us know how she did with her treatment.  ?

## 2021-11-21 ENCOUNTER — Other Ambulatory Visit: Payer: Self-pay | Admitting: Hematology and Oncology

## 2021-11-21 ENCOUNTER — Telehealth: Payer: Self-pay | Admitting: Oncology

## 2021-11-21 MED ORDER — GABAPENTIN 100 MG PO CAPS: 200.0000 mg | ORAL_CAPSULE | Freq: Two times a day (BID) | ORAL | 11 refills | Status: DC

## 2021-11-21 NOTE — Telephone Encounter (Signed)
Kristine White called and asked about gabapentin.  She said she had discussed increasing the tablet dose with Dr. Alvy Bimler at her visit this week.  She has not had to take it for the past 4-5 days but is now having burning again her hands and feet.  She is wondering if she should refill the 100 mg tablets from her original prescription or have Dr. Alvy Bimler send in a prescription to [facility] on [address]. ?

## 2021-11-21 NOTE — Telephone Encounter (Signed)
Let Carlis know the prescription has been sent to [facility]. ?

## 2021-11-21 NOTE — Telephone Encounter (Signed)
Called Cindy back and she would like to try the 200 mg BID. ?

## 2021-11-21 NOTE — Telephone Encounter (Signed)
done

## 2021-11-21 NOTE — Telephone Encounter (Signed)
I raised concerns the dose was too small ?My typical starting dose is 300 mg BID ?Does she wants to try that or continue 100 mg pills? How about 100 mg BID or 200 mg BID? ?

## 2021-11-24 ENCOUNTER — Encounter: Payer: Self-pay | Admitting: Hematology and Oncology

## 2021-11-26 ENCOUNTER — Encounter: Payer: Self-pay | Admitting: Hematology and Oncology

## 2021-12-09 ENCOUNTER — Other Ambulatory Visit: Payer: Self-pay | Admitting: Genetic Counselor

## 2021-12-09 DIAGNOSIS — C562 Malignant neoplasm of left ovary: Secondary | ICD-10-CM

## 2021-12-10 ENCOUNTER — Telehealth: Payer: Self-pay | Admitting: Pharmacist

## 2021-12-10 ENCOUNTER — Telehealth: Payer: Self-pay | Admitting: Oncology

## 2021-12-10 ENCOUNTER — Inpatient Hospital Stay: Payer: 59

## 2021-12-10 ENCOUNTER — Encounter: Payer: Self-pay | Admitting: Hematology and Oncology

## 2021-12-10 ENCOUNTER — Other Ambulatory Visit: Payer: Self-pay

## 2021-12-10 ENCOUNTER — Other Ambulatory Visit (HOSPITAL_COMMUNITY): Payer: Self-pay

## 2021-12-10 ENCOUNTER — Inpatient Hospital Stay (HOSPITAL_BASED_OUTPATIENT_CLINIC_OR_DEPARTMENT_OTHER): Payer: 59 | Admitting: Hematology and Oncology

## 2021-12-10 DIAGNOSIS — C562 Malignant neoplasm of left ovary: Secondary | ICD-10-CM | POA: Diagnosis not present

## 2021-12-10 DIAGNOSIS — M898X9 Other specified disorders of bone, unspecified site: Secondary | ICD-10-CM | POA: Diagnosis not present

## 2021-12-10 DIAGNOSIS — K5909 Other constipation: Secondary | ICD-10-CM

## 2021-12-10 DIAGNOSIS — T451X5A Adverse effect of antineoplastic and immunosuppressive drugs, initial encounter: Secondary | ICD-10-CM

## 2021-12-10 DIAGNOSIS — G620 Drug-induced polyneuropathy: Secondary | ICD-10-CM

## 2021-12-10 DIAGNOSIS — K2900 Acute gastritis without bleeding: Secondary | ICD-10-CM

## 2021-12-10 DIAGNOSIS — K2970 Gastritis, unspecified, without bleeding: Secondary | ICD-10-CM | POA: Insufficient documentation

## 2021-12-10 LAB — CMP (CANCER CENTER ONLY)
ALT: 37 U/L (ref 0–44)
AST: 17 U/L (ref 15–41)
Albumin: 4.4 g/dL (ref 3.5–5.0)
Alkaline Phosphatase: 75 U/L (ref 38–126)
Anion gap: 7 (ref 5–15)
BUN: 15 mg/dL (ref 6–20)
CO2: 23 mmol/L (ref 22–32)
Calcium: 9.2 mg/dL (ref 8.9–10.3)
Chloride: 109 mmol/L (ref 98–111)
Creatinine: 0.76 mg/dL (ref 0.44–1.00)
GFR, Estimated: >60 mL/min (ref >60)
Glucose, Bld: 173 mg/dL — ABNORMAL HIGH (ref 70–99)
Potassium: 4 mmol/L (ref 3.5–5.1)
Sodium: 139 mmol/L (ref 135–145)
Total Bilirubin: 0.3 mg/dL (ref 0.3–1.2)
Total Protein: 6.9 g/dL (ref 6.5–8.1)

## 2021-12-10 LAB — CBC WITH DIFFERENTIAL (CANCER CENTER ONLY)
Abs Immature Granulocytes: 0.08 10*3/uL — ABNORMAL HIGH (ref 0.00–0.07)
Basophils Absolute: 0 10*3/uL (ref 0.0–0.1)
Basophils Relative: 0 %
Eosinophils Absolute: 0 10*3/uL (ref 0.0–0.5)
Eosinophils Relative: 0 %
HCT: 33.3 % — ABNORMAL LOW (ref 36.0–46.0)
Hemoglobin: 11.1 g/dL — ABNORMAL LOW (ref 12.0–15.0)
Immature Granulocytes: 1 %
Lymphocytes Relative: 9 %
Lymphs Abs: 0.7 10*3/uL (ref 0.7–4.0)
MCH: 28.9 pg (ref 26.0–34.0)
MCHC: 33.3 g/dL (ref 30.0–36.0)
MCV: 86.7 fL (ref 80.0–100.0)
Monocytes Absolute: 0.1 10*3/uL (ref 0.1–1.0)
Monocytes Relative: 1 %
Neutro Abs: 6.2 10*3/uL (ref 1.7–7.7)
Neutrophils Relative %: 89 %
Platelet Count: 179 10*3/uL (ref 150–400)
RBC: 3.84 MIL/uL — ABNORMAL LOW (ref 3.87–5.11)
RDW: 14.6 % (ref 11.5–15.5)
WBC Count: 7 10*3/uL (ref 4.0–10.5)
nRBC: 0 % (ref 0.0–0.2)

## 2021-12-10 LAB — GENETIC SCREENING ORDER

## 2021-12-10 MED ORDER — SODIUM CHLORIDE 0.9 % IV SOLN
686.9640 mg | Freq: Once | INTRAVENOUS | Status: AC
  Administered 2021-12-10: 690 mg via INTRAVENOUS
  Filled 2021-12-10: qty 69

## 2021-12-10 MED ORDER — SODIUM CHLORIDE 0.9 % IV SOLN
10.0000 mg | Freq: Once | INTRAVENOUS | Status: AC
  Administered 2021-12-10: 10 mg via INTRAVENOUS
  Filled 2021-12-10: qty 10

## 2021-12-10 MED ORDER — OXYCODONE HCL 5 MG PO TABS
5.0000 mg | ORAL_TABLET | ORAL | 0 refills | Status: DC | PRN
  Filled 2021-12-10: qty 30, 5d supply, fill #0

## 2021-12-10 MED ORDER — SODIUM CHLORIDE 0.9% FLUSH
10.0000 mL | INTRAVENOUS | Status: DC | PRN
  Administered 2021-12-10: 10 mL

## 2021-12-10 MED ORDER — PANTOPRAZOLE SODIUM 40 MG PO TBEC
40.0000 mg | DELAYED_RELEASE_TABLET | Freq: Every day | ORAL | 3 refills | Status: DC
  Filled 2021-12-10: qty 30, 30d supply, fill #0

## 2021-12-10 MED ORDER — DIPHENHYDRAMINE HCL 50 MG/ML IJ SOLN
25.0000 mg | Freq: Once | INTRAMUSCULAR | Status: AC
  Administered 2021-12-10: 25 mg via INTRAVENOUS
  Filled 2021-12-10: qty 1

## 2021-12-10 MED ORDER — FAMOTIDINE IN NACL 20-0.9 MG/50ML-% IV SOLN
20.0000 mg | Freq: Once | INTRAVENOUS | Status: AC
  Administered 2021-12-10: 20 mg via INTRAVENOUS
  Filled 2021-12-10: qty 50

## 2021-12-10 MED ORDER — SODIUM CHLORIDE 0.9 % IV SOLN
140.0000 mg/m2 | Freq: Once | INTRAVENOUS | Status: AC
  Administered 2021-12-10: 246 mg via INTRAVENOUS
  Filled 2021-12-10: qty 41

## 2021-12-10 MED ORDER — SODIUM CHLORIDE 0.9 % IV SOLN
175.0000 mg/m2 | Freq: Once | INTRAVENOUS | Status: DC
  Filled 2021-12-10: qty 51

## 2021-12-10 MED ORDER — SODIUM CHLORIDE 0.9% FLUSH
10.0000 mL | Freq: Once | INTRAVENOUS | Status: AC
  Administered 2021-12-10: 10 mL

## 2021-12-10 MED ORDER — PALONOSETRON HCL INJECTION 0.25 MG/5ML
0.2500 mg | Freq: Once | INTRAVENOUS | Status: AC
  Administered 2021-12-10: 0.25 mg via INTRAVENOUS
  Filled 2021-12-10: qty 5

## 2021-12-10 MED ORDER — SODIUM CHLORIDE 0.9 % IV SOLN
150.0000 mg | Freq: Once | INTRAVENOUS | Status: AC
  Administered 2021-12-10: 150 mg via INTRAVENOUS
  Filled 2021-12-10: qty 150

## 2021-12-10 MED ORDER — DEXAMETHASONE 4 MG PO TABS: ORAL_TABLET | ORAL | 6 refills | Status: DC

## 2021-12-10 MED ORDER — SODIUM CHLORIDE 0.9 % IV SOLN: Freq: Once | INTRAVENOUS | Status: AC

## 2021-12-10 MED ORDER — FAMOTIDINE 20 MG PO TABS: 20.0000 mg | ORAL_TABLET | Freq: Every day | ORAL | Status: AC

## 2021-12-10 NOTE — Progress Notes (Signed)
[facility] ?OFFICE PROGRESS NOTE ? ?Patient Care Team: ?Yevette Edwards, MD as PCP - General (Internal Medicine) ? 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 ?Peripheral neuropathy due to chemotherapy [facility]) ?she has mild peripheral neuropathy, likely related to side effects of treatment. ?I plan to reduce the dose of treatment as outlined above.  ?I explained to the patient the rationale of this strategy and reassured the patient it would not compromise the efficacy of treatment ?She will continue gabapentin as needed ?She will continue cryotherapy during infusional Taxol ? ?Other constipation ?I warned her about risk of constipation with chemotherapy and with pain medicine ? ?Bone pain ?She has diffuse bone pain with each cycle of treatment ?Acetaminophen is not enough ?I recommend taking pain medicine as needed  ?I recommend her to take laxatives while on pain medicine ? ?Gastritis ?She has severe gastritis with each cycle of treatment ?I recommend reducing premed dexamethasone for future cycle ?I also recommend pantoprazole in the morning and Pepcid as needed in the evening ?I also recommended patient to take Tums as needed ? ?No orders of the defined types were placed in this encounter. ? ? ?All questions were answered. The patient knows to call the clinic with any problems, questions or concerns. ?The total time spent in the appointment was 40 minutes encounter  with patients including review of chart and various tests results, discussions about plan of care and coordination of care plan ?  ?Heath Lark, MD ?12/10/2021 10:55 AM ? ?INTERVAL HISTORY: ?Please see below for problem oriented charting. ?she returns for treatment follow-up prior to cycle 3 of chemotherapy ?She shared with me she had an appointment and follow-up at [facility] with [facility] imaging next month ?She has noted intermittent neuropathy, slightly better compared to her experience after cycle 1 ?She is only taking gabapentin at nighttime ?She denies severe constipation with this cycle of treatment ?She have severe and diffuse bone pain within a few days after chemotherapy and requested narcotic prescription ?She also have severe gastritis within a few days after treatment ? ?REVIEW OF SYSTEMS:   ?Constitutional: Denies fevers, chills or abnormal weight loss ?Eyes: Denies blurriness of vision ?Ears, nose, mouth, throat, and face: Denies mucositis or sore throat ?Respiratory: Denies cough, dyspnea or wheezes ?Cardiovascular: Denies palpitation, chest discomfort or lower extremity swelling ?Skin: Denies abnormal skin rashes ?Lymphatics: Denies new lymphadenopathy or easy bruising ?Behavioral/Psych: Mood is stable, no new changes  ?All other systems were reviewed with the patient and are negative. ? ?I have reviewed the past medical history, past surgical history, social history and family history with the patient and they are unchanged from previous note. ? ?ALLERGIES:  is allergic to heparin. ? ?MEDICATIONS:  ?Current Outpatient Medications  ?Medication Sig Dispense Refill  ? famotidine (PEPCID) 20 MG tablet Take 1 tablet (20 mg total) by mouth at bedtime.    ? oxyCODONE (OXY IR/ROXICODONE) 5 MG immediate release tablet Take 1 tablet by mouth every 4 hours as  needed for severe pain. 30 tablet 0  ? pantoprazole (PROTONIX) 40 MG tablet Take 1 tablet by mouth daily. 30 tablet 3  ? acetaminophen (TYLENOL) 325 MG tablet Take  325 mg by mouth every 6 (six) hours as needed.    ? Cholecalciferol 50 MCG (2000 UT) CAPS Take 2,000 Units by mouth daily.    ? dexamethasone (DECADRON) 4 MG tablet Take 2 tabs at the night before chemotherapy, every 3 weeks, by mouth 20 tablet 6  ? EPINEPHrine 0.3 mg/0.3 mL IJ SOAJ injection Inject into the muscle.    ? gabapentin (NEURONTIN) 100 MG capsule Take 2 capsules (200 mg total) by mouth 2 (two) times daily. 120 capsule 11  ? lidocaine-prilocaine (EMLA) cream Apply topically.    ? ondansetron (ZOFRAN) 8 MG tablet Take 1 tablet (8 mg total) by mouth 2 (two) times daily as needed for refractory nausea / vomiting. Start on day 3 after carboplatin chemo. 30 tablet 1  ? prochlorperazine (COMPAZINE) 10 MG tablet Take 1 tablet (10 mg total) by mouth every 6 (six) hours as needed (Nausea or vomiting). 30 tablet 1  ? ?No current facility-administered medications for this visit.  ? ?Facility-Administered Medications Ordered in Other Visits  ?Medication Dose Route Frequency Provider Last Rate Last Admin  ? CARBOplatin (PARAPLATIN) 690 mg in sodium chloride 0.9 % 250 mL chemo infusion  690 mg Intravenous Once Alvy Bimler, Skii Cleland, MD      ? fosaprepitant (EMEND) 150 mg in sodium chloride 0.9 % 145 mL IVPB  150 mg Intravenous Once Alvy Bimler, Isacc Turney, MD 450 mL/hr at 12/10/21 1044 150 mg at 12/10/21 1044  ? PACLitaxel (TAXOL) 306 mg in sodium chloride 0.9 % 500 mL chemo infusion (> '80mg'$ /m2)  175 mg/m2 (Order-Specific) Intravenous Once Alvy Bimler, Eutha Cude, MD      ? sodium chloride flush (NS) 0.9 % injection 10 mL  10 mL Intracatheter PRN Heath Lark, MD      ? ? ?SUMMARY OF ONCOLOGIC HISTORY: ?Oncology History Overview Note  ?Mixed endometrioid and seromucinous ?  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 She has never undergone biopsy of the retroperitoneal tissue and its been historically activated to her multiple abdominal surgeries.  MRI scan on 06/2020 showed fatty infiltration of the retroperitoneal space surrounding the aorta and IVC to the bifurcation with subcentimeter retroperitoneal nodes that appeared stable to marginally improved from prior CT, consistent with retroperitoneal fibrosis. At her most recent visit in November, she reported that she was overall doing well, continues to have chronic intermittent right upper quadrant abdominal pain. She had a repeat CT scan in November 2022 which showed an abnormal appearance of the left ovary with somewhat masslike appearance and ill-defined margins. She then had a follow-up ultrasound in November 2022 that showed a moderately lobulated mass within the left adnexa that was solid with mild peripheral and central vascularity, measuring about 4 cm, O-RADS 4, no findings of ascites. She has a history of endometriosis (s/p LSC ovarian cystectomies for several endometriomas and is s/p [facility] hysterectomy). ?  ?07/09/2021 Imaging  ? Ct abdomen and pelvis ?Abnormal appearance of the left ovary with somewhat mass like appearance and ill defined margins, possibly neoplasm.  Recommend pelvic ultrasound for further evaluation.  ? ?Infiltrative changes of the retroperitoneum, similar compared with previous examination, consistent with known retroperitoneal fibrosis   ?  ?09/19/2021 Pathology Results  ? A. Ovary and fallopian tube, left, salpingo-oophorectomy ?-  Endometrioid adenocarcinoma, seromucinous type, FIGO grade 1, present as multiple foci of invasive carcinoma arising in a background of serousmucinous borderline tumor, see comment and synoptic report ?-Ovarian surface involvement is identified ?-Left fallopian tube with focal endometriosis and focal foreign body giant cell reaction consistent with prior surgical intervention, no involvement by tumor identified ?  ?B.  Ovary and Fallopian tube, right, salpingo-oophorectomy ?-Serous cystadenoma, 1.7 cm, right ovary  ?-Background ovary with focal  endometriosis and surface adhesions ?-Right fallopian tube with serosal adhesions ?-No involvement by tumor is identified ?  ?C.  Omental biopsy ?-Benign adipose tissue, no involvement by tumor identified ?  ?2/2/

## 2021-12-10 NOTE — Assessment & Plan Note (Signed)
She has diffuse bone pain with each cycle of treatment ?Acetaminophen is not enough ?I recommend taking pain medicine as needed  ?I recommend her to take laxatives while on pain medicine ?

## 2021-12-10 NOTE — Assessment & Plan Note (Signed)
she has mild peripheral neuropathy, likely related to side effects of treatment. ?I plan to reduce the dose of treatment as outlined above.  ?I explained to the patient the rationale of this strategy and reassured the patient it would not compromise the efficacy of treatment ?She will continue gabapentin as needed ?She will continue cryotherapy during infusional Taxol ?

## 2021-12-10 NOTE — Assessment & Plan Note (Signed)
She has severe gastritis with each cycle of treatment ?I recommend reducing premed dexamethasone for future cycle ?I also recommend pantoprazole in the morning and Pepcid as needed in the evening ?I also recommended patient to take Tums as needed ?

## 2021-12-10 NOTE — Patient Instructions (Signed)
[facility]  Discharge Instructions: ?Thank you for choosing [facility] to provide your oncology and hematology care.  ? ?If you have a lab appointment with the [facility], please go directly to the [facility] and check in at the registration area. ?  ?Wear comfortable clothing and clothing appropriate for easy access to any Portacath or PICC line.  ? ?We strive to give you quality time with your provider. You may need to reschedule your appointment if you arrive late (15 or more minutes).  Arriving late affects you and other patients whose appointments are after yours.  Also, if you miss three or more appointments without notifying the office, you may be dismissed from the clinic at the provider?s discretion.    ?  ?For prescription refill requests, have your pharmacy contact our office and allow 72 hours for refills to be completed.   ? ?Today you received the following chemotherapy and/or immunotherapy agents Taxol, Carboplatin    ?  ?To help prevent nausea and vomiting after your treatment, we encourage you to take your nausea medication as directed. ? ?BELOW ARE SYMPTOMS THAT SHOULD BE REPORTED IMMEDIATELY: ?*FEVER GREATER THAN 100.4 F (38 ?C) OR HIGHER ?*CHILLS OR SWEATING ?*NAUSEA AND VOMITING THAT IS NOT CONTROLLED WITH YOUR NAUSEA MEDICATION ?*UNUSUAL SHORTNESS OF BREATH ?*UNUSUAL BRUISING OR BLEEDING ?*URINARY PROBLEMS (pain or burning when urinating, or frequent urination) ?*BOWEL PROBLEMS (unusual diarrhea, constipation, pain near the anus) ?TENDERNESS IN MOUTH AND THROAT WITH OR WITHOUT PRESENCE OF ULCERS (sore throat, sores in mouth, or a toothache) ?UNUSUAL RASH, SWELLING OR PAIN  ?UNUSUAL VAGINAL DISCHARGE OR ITCHING  ? ?Items with * indicate a potential emergency and should be followed up as soon as possible or go to the Emergency Department if any problems should occur. ? ?Please show the CHEMOTHERAPY ALERT CARD or IMMUNOTHERAPY ALERT CARD at  check-in to the Emergency Department and triage nurse. ? ?Should you have questions after your visit or need to cancel or reschedule your appointment, please contact [facility]  Dept: [phone]  and follow the prompts.  Office hours are 8:00 a.m. to 4:30 p.m. Monday - Friday. Please note that voicemails left after 4:00 p.m. may not be returned until the following business day.  We are closed weekends and major holidays. You have access to a nurse at all times for urgent questions. Please call the main number to the clinic Dept: [phone] and follow the prompts. ? ? ?For any non-urgent questions, you may also contact your provider using MyChart. We now offer e-Visits for anyone 76 and older to request care online for non-urgent symptoms. For details visit [email or web address]. ?  ?Also download the MyChart app! Go to the app store, search "MyChart", open the app, select [facility], and log in with your MyChart username and password. ? ?Due to Covid, a mask is required upon entering the hospital/clinic. If you do not have a mask, one will be given to you upon arrival. For doctor visits, patients may have 1 support person aged 74 or older with them. For treatment visits, patients cannot have anyone with them due to current Covid guidelines and our immunocompromised population.  ? ?Paclitaxel injection ?What is this medication? ?PACLITAXEL (PAK li TAX el) is a chemotherapy drug. It targets fast dividing cells, like cancer cells, and causes these cells to die. This medicine is used to treat ovarian cancer, breast cancer, lung cancer, Kaposi's sarcoma, and other cancers. ?This medicine  may be used for other purposes; ask your health care provider or pharmacist if you have questions. ?COMMON BRAND NAME(S): Onxol, Taxol ?What should I tell my care team before I take this medication? ?They need to know if you have any of these conditions: ?history of irregular heartbeat ?liver  disease ?low blood counts, like low white cell, platelet, or red cell counts ?lung or breathing disease, like asthma ?tingling of the fingers or toes, or other nerve disorder ?an unusual or allergic reaction to paclitaxel, alcohol, polyoxyethylated castor oil, other chemotherapy, other medicines, foods, dyes, or preservatives ?pregnant or trying to get pregnant ?breast-feeding ?How should I use this medication? ?This drug is given as an infusion into a vein. It is administered in a hospital or clinic by a specially trained health care professional. ?Talk to your pediatrician regarding the use of this medicine in children. Special care may be needed. ?Overdosage: If you think you have taken too much of this medicine contact a poison control center or emergency room at once. ?NOTE: This medicine is only for you. Do not share this medicine with others. ?What if I miss a dose? ?It is important not to miss your dose. Call your doctor or health care professional if you are unable to keep an appointment. ?What may interact with this medication? ?Do not take this medicine with any of the following medications: ?live virus vaccines ?This medicine may also interact with the following medications: ?antiviral medicines for hepatitis, HIV or AIDS ?certain antibiotics like erythromycin and clarithromycin ?certain medicines for fungal infections like ketoconazole and itraconazole ?certain medicines for seizures like carbamazepine, phenobarbital, phenytoin ?gemfibrozil ?nefazodone ?rifampin ?St. John's wort ?This list may not describe all possible interactions. Give your health care provider a list of all the medicines, herbs, non-prescription drugs, or dietary supplements you use. Also tell them if you smoke, drink alcohol, or use illegal drugs. Some items may interact with your medicine. ?What should I watch for while using this medication? ?Your condition will be monitored carefully while you are receiving this medicine. You  will need important blood work done while you are taking this medicine. ?This medicine can cause serious allergic reactions. To reduce your risk you will need to take other medicine(s) before treatment with this medicine. If you experience allergic reactions like skin rash, itching or hives, swelling of the face, lips, or tongue, tell your doctor or health care professional right away. ?In some cases, you may be given additional medicines to help with side effects. Follow all directions for their use. ?This drug may make you feel generally unwell. This is not uncommon, as chemotherapy can affect healthy cells as well as cancer cells. Report any side effects. Continue your course of treatment even though you feel ill unless your doctor tells you to stop. ?Call your doctor or health care professional for advice if you get a fever, chills or sore throat, or other symptoms of a cold or flu. Do not treat yourself. This drug decreases your body's ability to fight infections. Try to avoid being around people who are sick. ?This medicine may increase your risk to bruise or bleed. Call your doctor or health care professional if you notice any unusual bleeding. ?Be careful brushing and flossing your teeth or using a toothpick because you may get an infection or bleed more easily. If you have any dental work done, tell your dentist you are receiving this medicine. ?Avoid taking products that contain aspirin, acetaminophen, ibuprofen, naproxen, or ketoprofen unless instructed by  your doctor. These medicines may hide a fever. ?Do not become pregnant while taking this medicine. Women should inform their doctor if they wish to become pregnant or think they might be pregnant. There is a potential for serious side effects to an unborn child. Talk to your health care professional or pharmacist for more information. Do not breast-feed an infant while taking this medicine. ?Men are advised not to father a child while receiving this  medicine. ?This product may contain alcohol. Ask your pharmacist or healthcare provider if this medicine contains alcohol. Be sure to tell all healthcare providers you are taking this medicine. Certain medicines, like

## 2021-12-10 NOTE — Telephone Encounter (Signed)
Scheduled appointment per 04/25 los. Patient aware.  ?

## 2021-12-10 NOTE — Telephone Encounter (Signed)
Left a message advising genetic counseling appointment for tomorrow has been changed to a MyChart visit. ?

## 2021-12-10 NOTE — Assessment & Plan Note (Signed)
I warned her about risk of constipation with chemotherapy and with pain medicine ?

## 2021-12-10 NOTE — Assessment & Plan Note (Signed)
She had major side effects from treatment with severe bone pain requiring narcotics, intermittent but persistent neuropathy and others ?After a lot of discussion, we are in agreement to proceed with chemotherapy with minor dose adjustment of Taxol ?I will also prescribe pain medicine for the patient to take for few days after chemotherapy for severe bone pain ?We also discussed follow-up ?In my opinion, she would not need interim imaging study now and I would prefer to do imaging study after completion of chemotherapy ?

## 2021-12-11 ENCOUNTER — Inpatient Hospital Stay (HOSPITAL_BASED_OUTPATIENT_CLINIC_OR_DEPARTMENT_OTHER): Payer: 59 | Admitting: Genetic Counselor

## 2021-12-11 ENCOUNTER — Encounter: Payer: Self-pay | Admitting: Genetic Counselor

## 2021-12-11 DIAGNOSIS — Z8041 Family history of malignant neoplasm of ovary: Secondary | ICD-10-CM

## 2021-12-11 DIAGNOSIS — C562 Malignant neoplasm of left ovary: Secondary | ICD-10-CM | POA: Diagnosis not present

## 2021-12-11 DIAGNOSIS — Z803 Family history of malignant neoplasm of breast: Secondary | ICD-10-CM | POA: Diagnosis not present

## 2021-12-11 DIAGNOSIS — D126 Benign neoplasm of colon, unspecified: Secondary | ICD-10-CM | POA: Diagnosis not present

## 2021-12-11 NOTE — Progress Notes (Addendum)
REFERRING PROVIDER: ?Heath Lark, MD ?[address] ?[place],  [place] [address] ? ?PRIMARY PROVIDER:  ?Yevette Edwards, MD ? ?PRIMARY REASON FOR VISIT:  ?1. Family history of breast cancer   ?2. Family history of ovarian cancer   ?3. 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 ? ? ?HISTORY OF PRESENT ILLNESS:  I connected with  Kristine White on 12/11/2021 at 10 AM EDT by MyChart video conference and verified that I am speaking with the correct person using two identifiers.  ? ?Patient location: Home ?Provider location: [facility] [place]  ? ?Kristine White, a 56 y.o. female, was seen for a [place] [facility] cancer genetics consultation at the request of Dr. Alvy Bimler due to a personal and family history of ovarian cancer and FH of breast cancer.  Kristine White presents to clinic today to discuss the possibility of a hereditary predisposition to cancer, genetic testing, and to further clarify her future cancer risks, as well as potential cancer risks for family members.  ? ?In November 2022, at the age of 74, Kristine White was diagnosed with cancer of the left ovary. The treatment plan chemotherapy and surgery.  The patient reports that when she was 3 she had uncontrolled rectal bleeding which lead to a diagnosis of juvenile polyposis syndrome.  ?   ? ?CANCER HISTORY:  ?Oncology History Overview Note  ?Mixed endometrioid and seromucinous ?  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 She has never undergone biopsy of the retroperitoneal tissue and its been historically activated to her multiple abdominal surgeries. MRI scan on 06/2020 showed fatty infiltration of the retroperitoneal space surrounding the aorta and IVC to the bifurcation with subcentimeter retroperitoneal nodes that appeared stable to marginally improved from prior CT, consistent with retroperitoneal fibrosis. At her  most recent visit in November, she reported that she was overall doing well, continues to have chronic intermittent right upper quadrant abdominal pain. She had a repeat CT scan in November 2022 which showed an abnormal appearance of the left ovary with somewhat masslike appearance and ill-defined margins. She then had a follow-up ultrasound in November 2022 that showed a moderately lobulated mass within the left adnexa that was solid with mild peripheral and central vascularity, measuring about 4 cm, O-RADS 4, no findings of ascites. She has a history of endometriosis (s/p [facility]SC ovarian cystectomies for several endometriomas and is s/p [facility] hysterectomy). ?  ?07/09/2021 Imaging  ? Ct abdomen and pelvis ?Abnormal appearance of the left ovary with somewhat mass like appearance and ill defined margins, possibly neoplasm.  Recommend pelvic ultrasound for further evaluation.  ? ?Infiltrative changes of the retroperitoneum, similar compared with previous examination, consistent with known retroperitoneal fibrosis   ?  ?09/19/2021 Pathology Results  ? A. Ovary and fallopian tube, left, salpingo-oophorectomy ?- Endometrioid adenocarcinoma, seromucinous type, FIGO grade 1, present as multiple foci of invasive carcinoma arising in a background of serousmucinous borderline tumor, see comment and synoptic report ?-Ovarian surface involvement is identified ?-Left fallopian tube with focal endometriosis and focal foreign body giant cell reaction consistent with prior surgical intervention, no involvement by tumor identified ?  ?B.  Ovary and Fallopian tube, right, salpingo-oophorectomy ?-Serous cystadenoma, 1.7 cm, right ovary  ?-Background ovary with focal endometriosis and surface adhesions ?-Right fallopian tube with serosal adhesions ?-No involvement by tumor is identified ?  ?C.  Omental biopsy ?-Benign adipose tissue,  no involvement by tumor identified ?  ?09/19/2021 Surgery  ? Procedures: Robotic assisted bilateral  salpingo-oophorectomy (CPT [id number]), omental biopsy, bilateral ureterolysis (CPT [phone]), lysis of adhesions ? ?Surgeon: Cindie Laroche, MD ? ?Assistants: Bernadene Bell, MD - Fellow, Deeann Cree, MD - Resident ?Findings: On entry to abdomen, dense adhesions of the omentum to the anterior abdominal wall, unable to visualize upper abdomen, specifically unable to visualize the diaphragm, liver, stomach. Omentum grossly normal apart from adhesions. Entry site inspected with no trauma to surrounding structures. Bilateral complex ovarian masses, left approximately 4cm and right 3cm. Bilateral ovarian masses densely adherent to the pelvic sidewalls, and the right mass adherent to the mesentery of the rectum. Uterus and cervix surgically absent. Retroperitoneal fibrosis bilaterally requiring ureterolysis from the pelvic brim to the insertion into the bladder bilaterally. IOFS c/w borderline tumor.  ? ?Specimens:  ?ID Type Source Tests Collected by Time Destination  ?1 : left tube and ovary Tissue Ovary, Left SURGICAL PATHOLOGY EXAM Bernadene Bell, MD 09/19/2021 [phone]  ?2 : pelvic washing Washing Pelvic cavity CYTOLOGY - EXAM FROM OPTIME Christella Hartigan, MD 09/19/2021 [phone]  ?3 : right ovary and tube Tissue Ovary, Right SURGICAL PATHOLOGY EXAM Christella Hartigan, MD 09/19/2021 1012  ?4 : omental biopsy Tissue Omentum SURGICAL PATHOLOGY EXAM Christella Hartigan, MD 09/19/2021 1012  ?  ?10/29/2021 Procedure  ? Successful placement of a single lumen Power port catheter in the right internal jugular vein under ultrasound and fluoroscopy at [facility] ?  ?10/30/2021 -  Chemotherapy  ? She received first cycle of carboplatin and taxol at [facility] ?  ?11/04/2021 Initial Diagnosis  ? Left ovarian epithelial cancer [facility]) ?  ?11/04/2021 Cancer Staging  ? Staging form: Ovary, Fallopian Tube, and Primary Peritoneal Carcinoma, AJCC 8th Edition ?- Pathologic stage from 11/04/2021: FIGO Stage IC2, calculated as Stage IC (pT1c2, pN0, cM0) - Signed by Heath Lark, MD on 11/04/2021 ?Stage prefix: Initial diagnosis ? ?  ?11/11/2021 Imaging  ? 1. Right-sided venous Port-A-Cath positioning, as described above. ?2. No acute or active cardiopulmonary disease. ?  ?11/19/2021 -  Chemotherapy  ? Patient is on Treatment Plan : OVARIAN Carboplatin (AUC 6) / Paclitaxel (175) q21d x 6 cycles  ? ?  ?  ? ? ? ?RISK FACTORS:  ?Menarche was at age 68.  ?First live birth at age 35.  ?Ovaries intact: no.  ?Hysterectomy: yes.  ?Menopausal status: postmenopausal.  ?HRT use: 0 years. ?Colonoscopy: yes; abnormal. ?Mammogram within the last year: yes. ?Number of breast biopsies:  10+ biopsies . ?Up to date with pelvic exams: yes. ?Any excessive radiation exposure in the past: no ? ?Past Medical History:  ?Diagnosis Date  ? Asthma   ? Endometriosis   ? Family history of breast cancer   ? Family history of ovarian cancer   ? GERD (gastroesophageal reflux disease)   ? IBS (irritable bowel syndrome)   ? Idiopathic angioedema   ? Juvenile polyp of colon   ? Juvenile polyposis syndrome   ? Retroperitoneal fibrosis   ? ? ?Past Surgical History:  ?Procedure Laterality Date  ? ABDOMINAL HYSTERECTOMY    ? APPENDECTOMY    ? bile duct reconstruction    ? CHOLECYSTECTOMY    ? CHOLEDOCHOJEJUNOSTOMY    ? pancreatic duct surgery    ? POLYPECTOMY    ? TONSILLECTOMY    ? ? ?Social History  ? ?Socioeconomic History  ? Marital status: Married  ?  Spouse name: Tana Felts  ? Number of children:  2  ? Years of education: Not on file  ? Highest education level: Not on file  ?Occupational History  ? Occupation: clinical nurse specialist  ?Tobacco Use  ? Smoking status: Never  ? Smokeless tobacco: Never  ?Substance and Sexual Activity  ? Alcohol use: Not Currently  ? Drug use: Not on file  ? Sexual activity: Not on file  ?Other Topics Concern  ? Not on file  ?Social History Narrative  ? Not on file  ? ?Social Determinants of Health  ? ?Financial Resource Strain: Not on file  ?Food Insecurity: Not on file  ?Transportation  Needs: Not on file  ?Physical Activity: Not on file  ?Stress: Not on file  ?Social Connections: Not on file  ?  ? ?FAMILY HISTORY:  ?We obtained a detailed, 4-generation family history.  Significant diagnoses are l

## 2021-12-12 ENCOUNTER — Encounter: Payer: Self-pay | Admitting: Hematology and Oncology

## 2021-12-22 ENCOUNTER — Encounter: Payer: Self-pay | Admitting: Hematology and Oncology

## 2021-12-30 MED FILL — Dexamethasone Sodium Phosphate Inj 100 MG/10ML: INTRAMUSCULAR | Qty: 1 | Status: AC

## 2021-12-30 MED FILL — Fosaprepitant Dimeglumine For IV Infusion 150 MG (Base Eq): INTRAVENOUS | Qty: 5 | Status: AC

## 2021-12-31 ENCOUNTER — Inpatient Hospital Stay: Payer: 59

## 2021-12-31 ENCOUNTER — Inpatient Hospital Stay (HOSPITAL_BASED_OUTPATIENT_CLINIC_OR_DEPARTMENT_OTHER): Payer: 59 | Admitting: Hematology and Oncology

## 2021-12-31 ENCOUNTER — Inpatient Hospital Stay: Payer: 59 | Attending: Hematology and Oncology

## 2021-12-31 ENCOUNTER — Other Ambulatory Visit: Payer: Self-pay

## 2021-12-31 ENCOUNTER — Encounter: Payer: Self-pay | Admitting: Hematology and Oncology

## 2021-12-31 ENCOUNTER — Other Ambulatory Visit: Payer: Self-pay | Admitting: Hematology and Oncology

## 2021-12-31 VITALS — BP 123/83 | HR 70 | Temp 98.3°F | Resp 16

## 2021-12-31 DIAGNOSIS — G620 Drug-induced polyneuropathy: Secondary | ICD-10-CM | POA: Insufficient documentation

## 2021-12-31 DIAGNOSIS — M898X9 Other specified disorders of bone, unspecified site: Secondary | ICD-10-CM | POA: Diagnosis not present

## 2021-12-31 DIAGNOSIS — C562 Malignant neoplasm of left ovary: Secondary | ICD-10-CM | POA: Diagnosis present

## 2021-12-31 DIAGNOSIS — Z5111 Encounter for antineoplastic chemotherapy: Secondary | ICD-10-CM | POA: Diagnosis present

## 2021-12-31 DIAGNOSIS — T451X5A Adverse effect of antineoplastic and immunosuppressive drugs, initial encounter: Secondary | ICD-10-CM

## 2021-12-31 LAB — CBC WITH DIFFERENTIAL (CANCER CENTER ONLY)
Abs Immature Granulocytes: 0.04 10*3/uL (ref 0.00–0.07)
Basophils Absolute: 0 10*3/uL (ref 0.0–0.1)
Basophils Relative: 0 %
Eosinophils Absolute: 0 10*3/uL (ref 0.0–0.5)
Eosinophils Relative: 0 %
HCT: 33.2 % — ABNORMAL LOW (ref 36.0–46.0)
Hemoglobin: 11.3 g/dL — ABNORMAL LOW (ref 12.0–15.0)
Immature Granulocytes: 1 %
Lymphocytes Relative: 13 %
Lymphs Abs: 0.8 10*3/uL (ref 0.7–4.0)
MCH: 30.5 pg (ref 26.0–34.0)
MCHC: 34 g/dL (ref 30.0–36.0)
MCV: 89.7 fL (ref 80.0–100.0)
Monocytes Absolute: 0.1 10*3/uL (ref 0.1–1.0)
Monocytes Relative: 1 %
Neutro Abs: 5 10*3/uL (ref 1.7–7.7)
Neutrophils Relative %: 85 %
Platelet Count: 164 10*3/uL (ref 150–400)
RBC: 3.7 MIL/uL — ABNORMAL LOW (ref 3.87–5.11)
RDW: 17.2 % — ABNORMAL HIGH (ref 11.5–15.5)
WBC Count: 5.9 10*3/uL (ref 4.0–10.5)
nRBC: 0 % (ref 0.0–0.2)

## 2021-12-31 LAB — CMP (CANCER CENTER ONLY)
ALT: 29 U/L (ref 0–44)
AST: 17 U/L (ref 15–41)
Albumin: 4.5 g/dL (ref 3.5–5.0)
Alkaline Phosphatase: 73 U/L (ref 38–126)
Anion gap: 7 (ref 5–15)
BUN: 14 mg/dL (ref 6–20)
CO2: 26 mmol/L (ref 22–32)
Calcium: 9.4 mg/dL (ref 8.9–10.3)
Chloride: 107 mmol/L (ref 98–111)
Creatinine: 0.85 mg/dL (ref 0.44–1.00)
GFR, Estimated: >60 mL/min (ref >60)
Glucose, Bld: 179 mg/dL — ABNORMAL HIGH (ref 70–99)
Potassium: 4.1 mmol/L (ref 3.5–5.1)
Sodium: 140 mmol/L (ref 135–145)
Total Bilirubin: 0.4 mg/dL (ref 0.3–1.2)
Total Protein: 6.9 g/dL (ref 6.5–8.1)

## 2021-12-31 MED ORDER — ALTEPLASE 2 MG IJ SOLR
2.0000 mg | Freq: Once | INTRAMUSCULAR | Status: AC
  Administered 2021-12-31: 2 mg
  Filled 2021-12-31: qty 2

## 2021-12-31 MED ORDER — SODIUM CHLORIDE 0.9% FLUSH
10.0000 mL | Freq: Once | INTRAVENOUS | Status: AC
  Administered 2021-12-31: 10 mL

## 2021-12-31 MED ORDER — PALONOSETRON HCL INJECTION 0.25 MG/5ML
0.2500 mg | Freq: Once | INTRAVENOUS | Status: AC
  Administered 2021-12-31: 0.25 mg via INTRAVENOUS
  Filled 2021-12-31: qty 5

## 2021-12-31 MED ORDER — SODIUM CHLORIDE 0.9 % IV SOLN
140.0000 mg/m2 | Freq: Once | INTRAVENOUS | Status: AC
  Administered 2021-12-31: 246 mg via INTRAVENOUS
  Filled 2021-12-31: qty 41

## 2021-12-31 MED ORDER — FAMOTIDINE IN NACL 20-0.9 MG/50ML-% IV SOLN
20.0000 mg | Freq: Once | INTRAVENOUS | Status: AC
  Administered 2021-12-31: 20 mg via INTRAVENOUS
  Filled 2021-12-31: qty 50

## 2021-12-31 MED ORDER — SODIUM CHLORIDE 0.9% FLUSH
10.0000 mL | INTRAVENOUS | Status: DC | PRN
  Administered 2021-12-31: 10 mL

## 2021-12-31 MED ORDER — SODIUM CHLORIDE 0.9 % IV SOLN
10.0000 mg | Freq: Once | INTRAVENOUS | Status: AC
  Administered 2021-12-31: 10 mg via INTRAVENOUS
  Filled 2021-12-31: qty 10

## 2021-12-31 MED ORDER — SODIUM CHLORIDE 0.9 % IV SOLN
150.0000 mg | Freq: Once | INTRAVENOUS | Status: AC
  Administered 2021-12-31: 150 mg via INTRAVENOUS
  Filled 2021-12-31: qty 150

## 2021-12-31 MED ORDER — SODIUM CHLORIDE 0.9 % IV SOLN: Freq: Once | INTRAVENOUS | Status: AC

## 2021-12-31 MED ORDER — SODIUM CHLORIDE 0.9 % IV SOLN
620.0000 mg | Freq: Once | INTRAVENOUS | Status: AC
  Administered 2021-12-31: 620 mg via INTRAVENOUS
  Filled 2021-12-31: qty 62

## 2021-12-31 MED ORDER — DIPHENHYDRAMINE HCL 50 MG/ML IJ SOLN
25.0000 mg | Freq: Once | INTRAMUSCULAR | Status: AC
  Administered 2021-12-31: 25 mg via INTRAVENOUS
  Filled 2021-12-31: qty 1

## 2021-12-31 NOTE — Assessment & Plan Note (Signed)
This is less severe with recent reduced dose Taxol ?She has pain medicine to take as needed ?

## 2021-12-31 NOTE — Assessment & Plan Note (Signed)
She had less side effects from recent treatment with less severe bone pain, intermittent but persistent neuropathy and others ?We will proceed with treatment as scheduled ?I will see her again in 3 weeks for further follow-up ?I recommend repeat CT imaging after completion of treatment ?

## 2021-12-31 NOTE — Assessment & Plan Note (Signed)
This is stable ?We will continue conservative management and similar dose as before ?

## 2021-12-31 NOTE — Patient Instructions (Signed)
[facility]  Discharge Instructions: ?Thank you for choosing [facility] to provide your oncology and hematology care.  ? ?If you have a lab appointment with the [facility], please go directly to the [facility] and check in at the registration area. ?  ?Wear comfortable clothing and clothing appropriate for easy access to any Portacath or PICC line.  ? ?We strive to give you quality time with your provider. You may need to reschedule your appointment if you arrive late (15 or more minutes).  Arriving late affects you and other patients whose appointments are after yours.  Also, if you miss three or more appointments without notifying the office, you may be dismissed from the clinic at the provider?s discretion.    ?  ?For prescription refill requests, have your pharmacy contact our office and allow 72 hours for refills to be completed.   ? ?Today you received the following chemotherapy and/or immunotherapy agents Taxol, Carboplatin    ?  ?To help prevent nausea and vomiting after your treatment, we encourage you to take your nausea medication as directed. ? ?BELOW ARE SYMPTOMS THAT SHOULD BE REPORTED IMMEDIATELY: ?*FEVER GREATER THAN 100.4 F (38 ?C) OR HIGHER ?*CHILLS OR SWEATING ?*NAUSEA AND VOMITING THAT IS NOT CONTROLLED WITH YOUR NAUSEA MEDICATION ?*UNUSUAL SHORTNESS OF BREATH ?*UNUSUAL BRUISING OR BLEEDING ?*URINARY PROBLEMS (pain or burning when urinating, or frequent urination) ?*BOWEL PROBLEMS (unusual diarrhea, constipation, pain near the anus) ?TENDERNESS IN MOUTH AND THROAT WITH OR WITHOUT PRESENCE OF ULCERS (sore throat, sores in mouth, or a toothache) ?UNUSUAL RASH, SWELLING OR PAIN  ?UNUSUAL VAGINAL DISCHARGE OR ITCHING  ? ?Items with * indicate a potential emergency and should be followed up as soon as possible or go to the Emergency Department if any problems should occur. ? ?Please show the CHEMOTHERAPY ALERT CARD or IMMUNOTHERAPY ALERT CARD at  check-in to the Emergency Department and triage nurse. ? ?Should you have questions after your visit or need to cancel or reschedule your appointment, please contact [facility]  Dept: [phone]  and follow the prompts.  Office hours are 8:00 a.m. to 4:30 p.m. Monday - Friday. Please note that voicemails left after 4:00 p.m. may not be returned until the following business day.  We are closed weekends and major holidays. You have access to a nurse at all times for urgent questions. Please call the main number to the clinic Dept: [phone] and follow the prompts. ? ? ?For any non-urgent questions, you may also contact your provider using MyChart. We now offer e-Visits for anyone 63 and older to request care online for non-urgent symptoms. For details visit [email or web address]. ?  ?Also download the MyChart app! Go to the app store, search "MyChart", open the app, select [facility], and log in with your MyChart username and password. ? ?Due to Covid, a mask is required upon entering the hospital/clinic. If you do not have a mask, one will be given to you upon arrival. For doctor visits, patients may have 1 support person aged 59 or older with them. For treatment visits, patients cannot have anyone with them due to current Covid guidelines and our immunocompromised population.  ? ?Paclitaxel injection ?What is this medication? ?PACLITAXEL (PAK li TAX el) is a chemotherapy drug. It targets fast dividing cells, like cancer cells, and causes these cells to die. This medicine is used to treat ovarian cancer, breast cancer, lung cancer, Kaposi's sarcoma, and other cancers. ?This medicine  may be used for other purposes; ask your health care provider or pharmacist if you have questions. ?COMMON BRAND NAME(S): Onxol, Taxol ?What should I tell my care team before I take this medication? ?They need to know if you have any of these conditions: ?history of irregular heartbeat ?liver  disease ?low blood counts, like low white cell, platelet, or red cell counts ?lung or breathing disease, like asthma ?tingling of the fingers or toes, or other nerve disorder ?an unusual or allergic reaction to paclitaxel, alcohol, polyoxyethylated castor oil, other chemotherapy, other medicines, foods, dyes, or preservatives ?pregnant or trying to get pregnant ?breast-feeding ?How should I use this medication? ?This drug is given as an infusion into a vein. It is administered in a hospital or clinic by a specially trained health care professional. ?Talk to your pediatrician regarding the use of this medicine in children. Special care may be needed. ?Overdosage: If you think you have taken too much of this medicine contact a poison control center or emergency room at once. ?NOTE: This medicine is only for you. Do not share this medicine with others. ?What if I miss a dose? ?It is important not to miss your dose. Call your doctor or health care professional if you are unable to keep an appointment. ?What may interact with this medication? ?Do not take this medicine with any of the following medications: ?live virus vaccines ?This medicine may also interact with the following medications: ?antiviral medicines for hepatitis, HIV or AIDS ?certain antibiotics like erythromycin and clarithromycin ?certain medicines for fungal infections like ketoconazole and itraconazole ?certain medicines for seizures like carbamazepine, phenobarbital, phenytoin ?gemfibrozil ?nefazodone ?rifampin ?St. John's wort ?This list may not describe all possible interactions. Give your health care provider a list of all the medicines, herbs, non-prescription drugs, or dietary supplements you use. Also tell them if you smoke, drink alcohol, or use illegal drugs. Some items may interact with your medicine. ?What should I watch for while using this medication? ?Your condition will be monitored carefully while you are receiving this medicine. You  will need important blood work done while you are taking this medicine. ?This medicine can cause serious allergic reactions. To reduce your risk you will need to take other medicine(s) before treatment with this medicine. If you experience allergic reactions like skin rash, itching or hives, swelling of the face, lips, or tongue, tell your doctor or health care professional right away. ?In some cases, you may be given additional medicines to help with side effects. Follow all directions for their use. ?This drug may make you feel generally unwell. This is not uncommon, as chemotherapy can affect healthy cells as well as cancer cells. Report any side effects. Continue your course of treatment even though you feel ill unless your doctor tells you to stop. ?Call your doctor or health care professional for advice if you get a fever, chills or sore throat, or other symptoms of a cold or flu. Do not treat yourself. This drug decreases your body's ability to fight infections. Try to avoid being around people who are sick. ?This medicine may increase your risk to bruise or bleed. Call your doctor or health care professional if you notice any unusual bleeding. ?Be careful brushing and flossing your teeth or using a toothpick because you may get an infection or bleed more easily. If you have any dental work done, tell your dentist you are receiving this medicine. ?Avoid taking products that contain aspirin, acetaminophen, ibuprofen, naproxen, or ketoprofen unless instructed by  your doctor. These medicines may hide a fever. ?Do not become pregnant while taking this medicine. Women should inform their doctor if they wish to become pregnant or think they might be pregnant. There is a potential for serious side effects to an unborn child. Talk to your health care professional or pharmacist for more information. Do not breast-feed an infant while taking this medicine. ?Men are advised not to father a child while receiving this  medicine. ?This product may contain alcohol. Ask your pharmacist or healthcare provider if this medicine contains alcohol. Be sure to tell all healthcare providers you are taking this medicine. Certain medicines, like

## 2021-12-31 NOTE — Progress Notes (Signed)
[facility] ?OFFICE PROGRESS NOTE ? ?Patient Care Team: ?Yevette Edwards, MD as PCP - General (Internal Medicine) ? 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 ?Bone pain ?This is less severe with recent reduced dose Taxol ?She has pain medicine to take as needed ? ?Peripheral neuropathy due to chemotherapy [facility]) ?This is stable ?We will continue conservative management and similar dose as before ? ?No orders of the defined types were placed in this encounter. ? ? ?All questions were answered. The patient knows to call the clinic with any problems, questions or concerns. ?The total time spent in the appointment was 25 minutes encounter with patients including review of chart and various tests results, discussions about plan of care and coordination of care plan ?  ?Heath Lark, MD ?12/31/2021 11:24 AM ? ?INTERVAL HISTORY: ?Please see below for problem oriented charting. ?she returns for treatment follow-up seen prior to cycle 4 of chemotherapy ?She denies worsening peripheral neuropathy ?She continues to experience fatigue ?Constipation is better controlled with laxatives ?Overall, she tolerated this last cycle of therapy better ? ?REVIEW OF SYSTEMS:   ?Constitutional: Denies fevers, chills or abnormal weight loss ?Eyes: Denies blurriness of vision ?Ears, nose, mouth, throat, and face: Denies mucositis or sore throat ?Respiratory: Denies cough, dyspnea or wheezes ?Cardiovascular: Denies palpitation, chest discomfort or lower extremity swelling ?Gastrointestinal:  Denies nausea, heartburn or change in bowel habits ?Skin: Denies abnormal skin rashes ?Lymphatics: Denies new lymphadenopathy or easy  bruising ?Behavioral/Psych: Mood is stable, no new changes  ?All other systems were reviewed with the patient and are negative. ? ?I have reviewed the past medical history, past surgical history, social history and family history with the patient and they are unchanged from previous note. ? ?ALLERGIES:  is allergic to heparin. ? ?MEDICATIONS:  ?Current Outpatient Medications  ?Medication Sig Dispense Refill  ? acetaminophen (TYLENOL) 325 MG tablet Take 325 mg by mouth every 6 (six) hours as needed.    ? Cholecalciferol 50 MCG (2000 UT) CAPS Take 2,000 Units by mouth daily.    ? dexamethasone (DECADRON) 4 MG tablet Take 2 tabs at the night before chemotherapy, every 3 weeks, by mouth 20 tablet 6  ? EPINEPHrine 0.3 mg/0.3 mL IJ SOAJ injection Inject into the muscle.    ? famotidine (PEPCID) 20 MG tablet Take 1 tablet (20 mg total) by mouth at bedtime.    ? gabapentin (NEURONTIN) 100 MG capsule Take 2 capsules (200 mg total) by mouth 2 (two) times daily. 120 capsule 11  ? lidocaine-prilocaine (EMLA) cream Apply topically.    ? ondansetron (ZOFRAN) 8 MG tablet Take 1 tablet (8 mg total) by mouth 2 (two) times daily as needed for refractory nausea / vomiting. Start on day 3 after carboplatin chemo. 30 tablet 1  ? oxyCODONE (OXY IR/ROXICODONE) 5 MG immediate release tablet Take 1 tablet by mouth every 4 hours as needed for severe pain. 30 tablet 0  ? pantoprazole (PROTONIX) 40 MG tablet Take 1 tablet by mouth daily. 30 tablet 3  ? prochlorperazine (COMPAZINE) 10 MG tablet Take 1 tablet (10 mg total) by mouth every 6 (six) hours as needed (Nausea or vomiting). 30 tablet 1  ? ?No current facility-administered medications  for this visit.  ? ?Facility-Administered Medications Ordered in Other Visits  ?Medication Dose Route Frequency Provider Last Rate Last Admin  ? CARBOplatin (PARAPLATIN) 620 mg in sodium chloride 0.9 % 250 mL chemo infusion  620 mg Intravenous Once Alvy Bimler, Dirck Butch, MD      ? dexamethasone (DECADRON) 10 mg in  sodium chloride 0.9 % 50 mL IVPB  10 mg Intravenous Once Alvy Bimler, Ervan Heber, MD      ? famotidine (PEPCID) IVPB 20 mg premix  20 mg Intravenous Once Alvy Bimler, Adelynn Gipe, MD 200 mL/hr at 12/31/21 1110 20 mg at 12/31/21 1110  ? fosaprepitant (EMEND) 150 mg in sodium chloride 0.9 % 145 mL IVPB  150 mg Intravenous Once Alvy Bimler, Evelyn Moch, MD      ? PACLitaxel (TAXOL) 246 mg in sodium chloride 0.9 % 250 mL chemo infusion (> '80mg'$ /m2)  140 mg/m2 (Order-Specific) Intravenous Once Alvy Bimler, Merrik Puebla, MD      ? sodium chloride flush (NS) 0.9 % injection 10 mL  10 mL Intracatheter PRN Heath Lark, MD      ? ? ?SUMMARY OF ONCOLOGIC HISTORY: ?Oncology History Overview Note  ?Mixed endometrioid and seromucinous ?  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 She has never undergone biopsy of the retroperitoneal tissue and its been historically activated to her multiple abdominal surgeries. MRI scan on 06/2020 showed fatty infiltration of the retroperitoneal space surrounding the aorta and IVC to the bifurcation with subcentimeter retroperitoneal nodes that appeared stable to marginally improved from prior CT, consistent with retroperitoneal fibrosis. At her most recent visit in November, she reported that she was overall doing well, continues to have chronic intermittent right upper quadrant abdominal pain. She had a repeat CT scan in November 2022 which showed an abnormal appearance of the left ovary with somewhat masslike appearance and ill-defined margins. She then had a follow-up ultrasound in November 2022 that showed a moderately lobulated mass within the left adnexa that was solid with mild peripheral and central vascularity, measuring about 4 cm, O-RADS 4, no findings of ascites. She has a history of endometriosis (s/p [facility] ovarian cystectomies for several endometriomas and is s/p [facility] hysterectomy). ?  ?07/09/2021 Imaging  ? Ct abdomen and  pelvis ?Abnormal appearance of the left ovary with somewhat mass like appearance and ill defined margins, possibly neoplasm.  Recommend pelvic ultrasound for further evaluation.  ? ?Infiltrative changes of the retroperitoneum, similar compared with previous examination, consistent with known retroperitoneal fibrosis   ?  ?09/19/2021 Pathology Results  ? A. Ovary and fallopian tube, left, salpingo-oophorectomy ?- Endometrioid adenocarcinoma, seromucinous type, FIGO grade 1, present as multiple foci of invasive carcinoma arising in a background of serousmucinous borderline tumor, see comment and synoptic report ?-Ovarian surface involvement is identified ?-Left fallopian tube with focal endometriosis and focal foreign body giant cell reaction consistent with prior surgical intervention, no involvement by tumor identified ?  ?B.  Ovary and Fallopian tube, right, salpingo-oophorectomy ?-Serous cystadenoma, 1.7 cm, right ovary  ?-Background ovary with focal endometriosis and surface adhesions ?-Right fallopian tube with serosal adhesions ?-No involvement by tumor is identified ?  ?C.  Omental biopsy ?-Benign adipose tissue, no involvement by tumor identified ?  ?09/19/2021 Surgery  ? Procedures: Robotic assisted bilateral salpingo-oophorectomy (CPT P4008117), omental biopsy, bilateral ureterolysis (CPT 2237402865), lysis of adhesions ? ?Surgeon: Cindie Laroche, MD ? ?Assistants: Bernadene Bell, MD - Fellow, Deeann Cree, MD - Resident ?Findings: On entry to abdomen,  dense adhesions of the omentum to the anterior abdominal wall, unable to visualize upper abdomen, specifically unable to visualize the diaphragm, liver, stomach. Omentum grossly normal apart from adhesions. Entry site inspected with no trauma to surrounding structures. Bilateral complex ovarian masses, left approximately 4cm and right 3cm. Bilateral ovarian masses densely adherent to the pelvic sidewalls, and the right mass adherent to the mesentery of the rectum. Uterus  and cervix surgically absent. Retroperitoneal fibrosis bilaterally requiring ureterolysis from the pelvic brim to the insertion into the bladder bilaterally. IOFS c/w borderline tumor.  ? ?Specimens:  ?ID Type Source Tests

## 2022-01-01 ENCOUNTER — Encounter: Payer: Self-pay | Admitting: Hematology and Oncology

## 2022-01-03 ENCOUNTER — Encounter: Payer: Self-pay | Admitting: Genetic Counselor

## 2022-01-03 ENCOUNTER — Telehealth: Payer: Self-pay | Admitting: Genetic Counselor

## 2022-01-03 DIAGNOSIS — Z1379 Encounter for other screening for genetic and chromosomal anomalies: Secondary | ICD-10-CM | POA: Insufficient documentation

## 2022-01-03 NOTE — Telephone Encounter (Signed)
LM on VM that results were back and to please call. ?

## 2022-01-03 NOTE — Telephone Encounter (Signed)
Revealed negative genetic testing.  Discussed that we do not know why she has ovarian cancer and polyposis or why there is cancer in the family. It could be due to a different gene that we are not testing, or maybe our current technology may not be able to pick something up.  It will be important for her to keep in contact with genetics to keep up with whether additional testing may be needed.

## 2022-01-06 ENCOUNTER — Ambulatory Visit: Payer: Self-pay | Admitting: Genetic Counselor

## 2022-01-06 DIAGNOSIS — Z1379 Encounter for other screening for genetic and chromosomal anomalies: Secondary | ICD-10-CM

## 2022-01-06 DIAGNOSIS — C562 Malignant neoplasm of left ovary: Secondary | ICD-10-CM

## 2022-01-06 NOTE — Progress Notes (Signed)
HPI:  Ms. Camarena was previously seen in the [place] [facility] clinic due to a personal and family history of cancer and concerns regarding a hereditary predisposition to cancer. Please refer to our prior cancer genetics clinic note for more information regarding our discussion, assessment and recommendations, at the time. Ms. Mckay recent genetic test results were disclosed to her, as were recommendations warranted by these results. These results and recommendations are discussed in more detail below.  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 She has never undergone biopsy of the retroperitoneal tissue and its been historically activated to her multiple abdominal surgeries. MRI scan on 06/2020 showed fatty infiltration of the retroperitoneal space surrounding the aorta and IVC to the bifurcation with subcentimeter retroperitoneal nodes that appeared stable to marginally improved from prior CT, consistent with retroperitoneal fibrosis. At her most recent visit in November, she reported that she was overall doing well, continues to have chronic intermittent right upper quadrant abdominal pain. She had a repeat CT scan in November 2022 which showed an abnormal appearance of the left ovary with somewhat masslike appearance and ill-defined margins. She then had a follow-up ultrasound in November 2022 that showed a moderately lobulated mass within the left adnexa that was solid with mild peripheral and central vascularity, measuring about 4 cm, O-RADS 4, no findings of ascites. She has a history of endometriosis (s/p [facility] ovarian cystectomies for several endometriomas and is s/p [facility] hysterectomy).   07/09/2021 Imaging   Ct abdomen and pelvis Abnormal appearance of the left ovary with somewhat mass like  appearance and ill defined margins, possibly neoplasm.  Recommend pelvic ultrasound for further evaluation.   Infiltrative changes of the retroperitoneum, similar compared with previous examination, consistent with known retroperitoneal fibrosis     09/19/2021 Pathology Results   A. Ovary and fallopian tube, left, salpingo-oophorectomy - Endometrioid adenocarcinoma, seromucinous type, FIGO grade 1, present as multiple foci of invasive carcinoma arising in a background of serousmucinous borderline tumor, see comment and synoptic report -Ovarian surface involvement is identified -Left fallopian tube with focal endometriosis and focal foreign body giant cell reaction consistent with prior surgical intervention, no involvement by tumor identified   B.  Ovary and Fallopian tube, right, salpingo-oophorectomy -Serous cystadenoma, 1.7 cm, right ovary  -Background ovary with focal endometriosis and surface adhesions -Right fallopian tube with serosal adhesions -No involvement by tumor is identified   C.  Omental biopsy -Benign adipose tissue, no involvement by tumor identified   09/19/2021 Surgery   Procedures: Robotic assisted bilateral salpingo-oophorectomy (CPT [phone]), omental biopsy, bilateral ureterolysis (CPT 76160), lysis of adhesions  Surgeon: Cindie Laroche, MD  Assistants: Bernadene Bell, MD - Fellow, Deeann Cree, MD - Resident Findings: On entry to abdomen, dense adhesions of the omentum to the anterior abdominal wall, unable to visualize upper abdomen, specifically unable to visualize the diaphragm, liver, stomach. Omentum grossly normal apart from adhesions. Entry site inspected with no trauma to surrounding structures. Bilateral complex ovarian masses, left approximately 4cm and right 3cm. Bilateral ovarian masses densely adherent to the pelvic sidewalls, and the right mass adherent to the mesentery of the rectum. Uterus and cervix surgically absent. Retroperitoneal fibrosis bilaterally  requiring ureterolysis from the pelvic brim to the insertion into the bladder bilaterally. IOFS c/w borderline tumor.   Specimens:  ID Type Source Tests Collected by  Time Destination  1 : left tube and ovary Tissue Ovary, Left SURGICAL PATHOLOGY EXAM Bernadene Bell, MD 09/19/2021 [phone]  2 : pelvic washing Washing Pelvic cavity CYTOLOGY - EXAM FROM OPTIME Christella Hartigan, MD 09/19/2021 [id number]  3 : right ovary and tube Tissue Ovary, Right SURGICAL PATHOLOGY EXAM Christella Hartigan, MD 09/19/2021 1012  4 : omental biopsy Tissue Omentum SURGICAL PATHOLOGY EXAM Christella Hartigan, MD 09/19/2021 1012    10/29/2021 Procedure   Successful placement of a single lumen Power port catheter in the right internal jugular vein under ultrasound and fluoroscopy at [facility]   10/30/2021 -  Chemotherapy   She received first cycle of carboplatin and taxol at [facility]   11/04/2021 Initial Diagnosis   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 Right-sided venous Port-A-Cath positioning, as described above. 2. No acute or active cardiopulmonary disease.   11/19/2021 -  Chemotherapy   Patient is on Treatment Plan : OVARIAN Carboplatin (AUC 6) / Paclitaxel (175) q21d x 6 cycles      01/02/2022 Genetic Testing   Negative genetic testing on the cancerNext-Expanded+RNAinsight panel.  The report date is Jan 02, 2022.  The CancerNext-Expanded gene panel offered by [facility] and includes sequencing and rearrangement analysis for the following 77 genes: AIP, ALK, APC*, ATM*, AXIN2, BAP1, BARD1, BLM, BMPR1A, BRCA1*, BRCA2*, BRIP1*, CDC73, CDH1*, CDK4, CDKN1B, CDKN2A, CHEK2*, CTNNA1, DICER1, FANCC, FH, FLCN, GALNT12, KIF1B,  LZTR1, MAX, MEN1, MET, MLH1*, MSH2*, MSH3, MSH6*, MUTYH*, NBN, NF1*, NF2, NTHL1, PALB2*, PHOX2B, PMS2*, POT1, PRKAR1A, PTCH1, PTEN*, RAD51C*, RAD51D*, RB1, RECQL, RET, SDHA, SDHAF2, SDHB, SDHC, SDHD, SMAD4, SMARCA4, SMARCB1, SMARCE1, STK11, SUFU, TMEM127, TP53*, TSC1, TSC2, VHL and XRCC2 (sequencing and deletion/duplication); EGFR, EGLN1, HOXB13, KIT, MITF, PDGFRA, POLD1, and POLE (sequencing only); EPCAM and GREM1 (deletion/duplication only). DNA and RNA analyses performed for * genes.      FAMILY HISTORY:  We obtained a detailed, 4-generation family history.  Significant diagnoses are listed below: Family History  Problem Relation Age of Onset   Breast cancer Mother 15   Ovarian cancer Mother 82   Cancer Father        scrotal cancer   Lung cancer Father    Breast cancer Other    Kidney cancer Other    Breast cancer Half-Sister 17       Neg GT      The patient has a son and daughter who are cancer free.  She has three maternal half siblings, a brother and two sisters.  One sister was diagnosed with breast cancer at 71 and reportedly has negative genetic testing.  Her mother is deceased and her father is living.   The patient's mother was diagnosed with breast cancer at 56 and ovarian cancer at 28.  She had one brother who is cancer free.  Her parents are deceased in their 76's.  Her grandfather's sister had breast cancer and died in her 53's.   The patient's father had lung and scrotal cancer.  He had 9 siblings, none had cancer.  His parents died of heart disease.   Ms. Jeon is aware of previous family history of genetic testing for hereditary cancer risks. Patient's maternal ancestors are of [place],  [place], and [place] descent, and paternal ancestors are of [place], Swedish/D[place] descent. There is no reported Ashkenazi Jewish ancestry. There is no known consanguinity.  GENETIC TEST RESULTS: Genetic testing reported out on Jan 02, 2022 through the CancerNext-Expanded+RNAinsight  cancer panel found no pathogenic mutations. The CancerNext-Expanded gene panel offered by [facility] and includes sequencing and rearrangement analysis for the following 77 genes: AIP, ALK, APC*, ATM*, AXIN2, BAP1, BARD1, BLM, BMPR1A, BRCA1*, BRCA2*, BRIP1*, CDC73, CDH1*, CDK4, CDKN1B, CDKN2A, CHEK2*, CTNNA1, DICER1, FANCC, FH, FLCN, GALNT12, KIF1B, LZTR1, MAX, MEN1, MET, MLH1*, MSH2*, MSH3, MSH6*, MUTYH*, NBN, NF1*, NF2, NTHL1, PALB2*, PHOX2B, PMS2*, POT1, PRKAR1A, PTCH1, PTEN*, RAD51C*, RAD51D*, RB1, RECQL, RET, SDHA, SDHAF2, SDHB, SDHC, SDHD, SMAD4, SMARCA4, SMARCB1, SMARCE1, STK11, SUFU, TMEM127, TP53*, TSC1, TSC2, VHL and XRCC2 (sequencing and deletion/duplication); EGFR, EGLN1, HOXB13, KIT, MITF, PDGFRA, POLD1, and POLE (sequencing only); EPCAM and GREM1 (deletion/duplication only). DNA and RNA analyses performed for * genes. The test report has been scanned into EPIC and is located under the Molecular Pathology section of the Results Review tab.  A portion of the result report is included below for reference.     We discussed with Ms. Finau that because current genetic testing is not perfect, it is possible there may be a gene mutation in one of these genes that current testing cannot detect, but that chance is small.  We also discussed, that there could be another gene that has not yet been discovered, or that we have not yet tested, that is responsible for the cancer diagnoses in the family. It is also possible there is a hereditary cause for the cancer in the family that Ms. Vogelgesang did not inherit and therefore was not identified in her testing.  Therefore, it is important to remain in touch with cancer genetics in the future so that we can continue to offer Ms. Lheureux the most up to date genetic testing.   ADDITIONAL GENETIC TESTING: We discussed with Ms. Tiedt that her genetic testing was fairly extensive.  If there are genes identified to increase cancer risk that can be analyzed in the  future, we would be happy to discuss and coordinate this testing at that time.    CANCER SCREENING RECOMMENDATIONS: Ms. Fronczak test result is considered negative (normal).  This means that we have not identified a hereditary cause for her personal and family history of cancer at this time. Most cancers happen by chance and this negative test suggests that her cancer may fall into this category.    While reassuring, this does not definitively rule out a hereditary predisposition to cancer. It is still possible that there could be genetic mutations that are undetectable by current technology. There could be genetic mutations in genes that have not been tested or identified to increase cancer risk.  Therefore, it is recommended she continue to follow the cancer management and screening guidelines provided by her oncology and primary healthcare provider.   An individual's cancer risk and medical management are not determined by genetic test results alone. Overall cancer risk assessment incorporates additional factors, including personal medical history, family history, and any available genetic information that may result in a personalized plan for cancer prevention and surveillance  RECOMMENDATIONS FOR FAMILY MEMBERS:  Individuals in this family might be at some increased risk of developing cancer, over the general population risk, simply due to the family history of cancer.  We recommended women in this family have a yearly mammogram beginning at age 53, or 67 years younger  than the earliest onset of cancer, an annual clinical breast exam, and perform monthly breast self-exams. Women in this family should also have a gynecological exam as recommended by their primary provider. All family members should be referred for colonoscopy starting at age 47.  FOLLOW-UP: Lastly, we discussed with Ms. Kovich that cancer genetics is a rapidly advancing field and it is possible that new genetic tests will be  appropriate for her and/or her family members in the future. We encouraged her to remain in contact with cancer genetics on an annual basis so we can update her personal and family histories and let her know of advances in cancer genetics that may benefit this family.   Our contact number was provided. Ms. Alpert questions were answered to her satisfaction, and she knows she is welcome to call us at anytime with additional questions or concerns.   Roma Kayser, [place], [facility] Licensed, Certified Genetic Counselor [email or web address]

## 2022-01-07 ENCOUNTER — Emergency Department (HOSPITAL_COMMUNITY): Payer: 59

## 2022-01-07 ENCOUNTER — Observation Stay (HOSPITAL_COMMUNITY)
Admission: EM | Admit: 2022-01-07 | Discharge: 2022-01-08 | Disposition: A | Discharge status: Home / Self Care | Payer: 59 | Attending: Family Medicine | Admitting: Family Medicine

## 2022-01-07 ENCOUNTER — Encounter (HOSPITAL_COMMUNITY): Payer: Self-pay

## 2022-01-07 ENCOUNTER — Other Ambulatory Visit: Payer: Self-pay

## 2022-01-07 ENCOUNTER — Observation Stay (HOSPITAL_BASED_OUTPATIENT_CLINIC_OR_DEPARTMENT_OTHER): Payer: 59

## 2022-01-07 ENCOUNTER — Encounter: Payer: Self-pay | Admitting: Hematology and Oncology

## 2022-01-07 DIAGNOSIS — R918 Other nonspecific abnormal finding of lung field: Secondary | ICD-10-CM

## 2022-01-07 DIAGNOSIS — R0781 Pleurodynia: Secondary | ICD-10-CM | POA: Diagnosis present

## 2022-01-07 DIAGNOSIS — R7401 Elevation of levels of liver transaminase levels: Secondary | ICD-10-CM | POA: Insufficient documentation

## 2022-01-07 DIAGNOSIS — R911 Solitary pulmonary nodule: Secondary | ICD-10-CM | POA: Insufficient documentation

## 2022-01-07 DIAGNOSIS — I2699 Other pulmonary embolism without acute cor pulmonale: Principal | ICD-10-CM

## 2022-01-07 DIAGNOSIS — J45909 Unspecified asthma, uncomplicated: Secondary | ICD-10-CM | POA: Insufficient documentation

## 2022-01-07 DIAGNOSIS — C562 Malignant neoplasm of left ovary: Secondary | ICD-10-CM | POA: Diagnosis not present

## 2022-01-07 DIAGNOSIS — I2694 Multiple subsegmental pulmonary emboli without acute cor pulmonale: Secondary | ICD-10-CM | POA: Diagnosis not present

## 2022-01-07 DIAGNOSIS — Z79899 Other long term (current) drug therapy: Secondary | ICD-10-CM | POA: Insufficient documentation

## 2022-01-07 DIAGNOSIS — D6481 Anemia due to antineoplastic chemotherapy: Secondary | ICD-10-CM | POA: Insufficient documentation

## 2022-01-07 DIAGNOSIS — T451X5A Adverse effect of antineoplastic and immunosuppressive drugs, initial encounter: Secondary | ICD-10-CM | POA: Diagnosis present

## 2022-01-07 DIAGNOSIS — G620 Drug-induced polyneuropathy: Secondary | ICD-10-CM | POA: Diagnosis present

## 2022-01-07 DIAGNOSIS — J9811 Atelectasis: Secondary | ICD-10-CM | POA: Diagnosis present

## 2022-01-07 DIAGNOSIS — R079 Chest pain, unspecified: Secondary | ICD-10-CM

## 2022-01-07 DIAGNOSIS — G629 Polyneuropathy, unspecified: Secondary | ICD-10-CM | POA: Insufficient documentation

## 2022-01-07 DIAGNOSIS — R1011 Right upper quadrant pain: Secondary | ICD-10-CM | POA: Diagnosis not present

## 2022-01-07 DIAGNOSIS — R739 Hyperglycemia, unspecified: Secondary | ICD-10-CM | POA: Diagnosis present

## 2022-01-07 HISTORY — DX: Other pulmonary embolism without acute cor pulmonale: I26.99

## 2022-01-07 HISTORY — DX: Other nonspecific abnormal finding of lung field: R91.8

## 2022-01-07 LAB — CBC WITH DIFFERENTIAL/PLATELET
Abs Immature Granulocytes: 0.06 10*3/uL (ref 0.00–0.07)
Basophils Absolute: 0 10*3/uL (ref 0.0–0.1)
Basophils Relative: 1 %
Eosinophils Absolute: 0.1 10*3/uL (ref 0.0–0.5)
Eosinophils Relative: 1 %
HCT: 30.8 % — ABNORMAL LOW (ref 36.0–46.0)
Hemoglobin: 10.5 g/dL — ABNORMAL LOW (ref 12.0–15.0)
Immature Granulocytes: 1 %
Lymphocytes Relative: 19 %
Lymphs Abs: 1 10*3/uL (ref 0.7–4.0)
MCH: 30.5 pg (ref 26.0–34.0)
MCHC: 34.1 g/dL (ref 30.0–36.0)
MCV: 89.5 fL (ref 80.0–100.0)
Monocytes Absolute: 0.2 10*3/uL (ref 0.1–1.0)
Monocytes Relative: 4 %
Neutro Abs: 3.8 10*3/uL (ref 1.7–7.7)
Neutrophils Relative %: 74 %
Platelets: 204 10*3/uL (ref 150–400)
RBC: 3.44 MIL/uL — ABNORMAL LOW (ref 3.87–5.11)
RDW: 16.2 % — ABNORMAL HIGH (ref 11.5–15.5)
WBC: 5.2 10*3/uL (ref 4.0–10.5)
nRBC: 0 % (ref 0.0–0.2)

## 2022-01-07 LAB — URINALYSIS, ROUTINE W REFLEX MICROSCOPIC
Bacteria, UA: NONE SEEN
Bilirubin Urine: NEGATIVE
Glucose, UA: NEGATIVE mg/dL
Ketones, ur: NEGATIVE mg/dL
Nitrite: NEGATIVE
Protein, ur: NEGATIVE mg/dL
Specific Gravity, Urine: 1.027 (ref 1.005–1.030)
pH: 5 (ref 5.0–8.0)

## 2022-01-07 LAB — COMPREHENSIVE METABOLIC PANEL
ALT: 56 U/L — ABNORMAL HIGH (ref 0–44)
AST: 26 U/L (ref 15–41)
Albumin: 3.9 g/dL (ref 3.5–5.0)
Alkaline Phosphatase: 73 U/L (ref 38–126)
Anion gap: 7 (ref 5–15)
BUN: 18 mg/dL (ref 6–20)
CO2: 24 mmol/L (ref 22–32)
Calcium: 8.9 mg/dL (ref 8.9–10.3)
Chloride: 106 mmol/L (ref 98–111)
Creatinine, Ser: 0.7 mg/dL (ref 0.44–1.00)
GFR, Estimated: >60 mL/min (ref >60)
Glucose, Bld: 123 mg/dL — ABNORMAL HIGH (ref 70–99)
Potassium: 4 mmol/L (ref 3.5–5.1)
Sodium: 137 mmol/L (ref 135–145)
Total Bilirubin: 0.5 mg/dL (ref 0.3–1.2)
Total Protein: 6.8 g/dL (ref 6.5–8.1)

## 2022-01-07 LAB — I-STAT BETA HCG BLOOD, ED (MC, WL, AP ONLY): I-stat hCG, quantitative: 140.4 m[IU]/mL — ABNORMAL HIGH (ref <5)

## 2022-01-07 LAB — TROPONIN I (HIGH SENSITIVITY): Troponin I (High Sensitivity): 2 ng/L (ref <18)

## 2022-01-07 LAB — ECHOCARDIOGRAM COMPLETE
AR max vel: 2.5 cm2
AV Peak grad: 8.2 mmHg
Ao pk vel: 1.43 m/s
Area-P 1/2: 3.85 cm2
S' Lateral: 3 cm

## 2022-01-07 LAB — D-DIMER, QUANTITATIVE: D-Dimer, Quant: 2.8 ug/mL-FEU — ABNORMAL HIGH (ref 0.00–0.50)

## 2022-01-07 LAB — LIPASE, BLOOD: Lipase: 34 U/L (ref 11–51)

## 2022-01-07 MED ORDER — PROCHLORPERAZINE MALEATE 10 MG PO TABS: 10.0000 mg | ORAL_TABLET | Freq: Four times a day (QID) | ORAL | Status: DC | PRN

## 2022-01-07 MED ORDER — ONDANSETRON HCL 4 MG PO TABS: 4.0000 mg | ORAL_TABLET | Freq: Four times a day (QID) | ORAL | Status: DC | PRN

## 2022-01-07 MED ORDER — FAMOTIDINE 20 MG PO TABS
20.0000 mg | ORAL_TABLET | Freq: Every day | ORAL | Status: DC
Start: 2022-01-07 — End: 2022-01-08
  Administered 2022-01-07: 20 mg via ORAL
  Filled 2022-01-07: qty 1

## 2022-01-07 MED ORDER — ONDANSETRON HCL 4 MG/2ML IJ SOLN
4.0000 mg | Freq: Four times a day (QID) | INTRAMUSCULAR | Status: DC | PRN
Start: 2022-01-07 — End: 2022-01-08
  Administered 2022-01-07: 4 mg via INTRAVENOUS
  Filled 2022-01-07: qty 2

## 2022-01-07 MED ORDER — HYDROMORPHONE HCL 1 MG/ML IJ SOLN
0.5000 mg | Freq: Once | INTRAMUSCULAR | Status: AC
  Administered 2022-01-07: 0.5 mg via INTRAVENOUS
  Filled 2022-01-07: qty 1

## 2022-01-07 MED ORDER — APIXABAN 5 MG PO TABS
10.0000 mg | ORAL_TABLET | Freq: Two times a day (BID) | ORAL | Status: DC
  Administered 2022-01-07 – 2022-01-08 (×3): 10 mg via ORAL
  Filled 2022-01-07 (×3): qty 2

## 2022-01-07 MED ORDER — OXYCODONE HCL 5 MG PO TABS
5.0000 mg | ORAL_TABLET | ORAL | Status: DC | PRN
  Administered 2022-01-07 (×2): 5 mg via ORAL
  Filled 2022-01-07 (×2): qty 1

## 2022-01-07 MED ORDER — ACETAMINOPHEN 325 MG PO TABS: 650.0000 mg | ORAL_TABLET | Freq: Four times a day (QID) | ORAL | Status: DC | PRN

## 2022-01-07 MED ORDER — SODIUM CHLORIDE (PF) 0.9 % IJ SOLN
INTRAMUSCULAR | Status: AC
  Filled 2022-01-07: qty 50

## 2022-01-07 MED ORDER — PANTOPRAZOLE SODIUM 40 MG PO TBEC
40.0000 mg | DELAYED_RELEASE_TABLET | Freq: Every day | ORAL | Status: DC
  Administered 2022-01-07 – 2022-01-08 (×2): 40 mg via ORAL
  Filled 2022-01-07 (×2): qty 1

## 2022-01-07 MED ORDER — GABAPENTIN 100 MG PO CAPS
200.0000 mg | ORAL_CAPSULE | Freq: Two times a day (BID) | ORAL | Status: DC
  Administered 2022-01-07 – 2022-01-08 (×3): 200 mg via ORAL
  Filled 2022-01-07 (×3): qty 2

## 2022-01-07 MED ORDER — SODIUM CHLORIDE 0.9 % IV BOLUS
1000.0000 mL | Freq: Once | INTRAVENOUS | Status: AC
  Administered 2022-01-07: 1000 mL via INTRAVENOUS

## 2022-01-07 MED ORDER — APIXABAN 5 MG PO TABS: 5.0000 mg | ORAL_TABLET | Freq: Two times a day (BID) | ORAL | Status: DC

## 2022-01-07 MED ORDER — IOHEXOL 350 MG/ML SOLN
80.0000 mL | Freq: Once | INTRAVENOUS | Status: AC | PRN
  Administered 2022-01-07: 80 mL via INTRAVENOUS

## 2022-01-07 MED ORDER — ACETAMINOPHEN 650 MG RE SUPP: 650.0000 mg | Freq: Four times a day (QID) | RECTAL | Status: DC | PRN

## 2022-01-07 NOTE — ED Provider Notes (Addendum)
[facility] DEPT Provider Note  CSN: [id number] Arrival date & time: 01/07/22 9326  Chief Complaint(s) Abdominal Pain and right sided chest pain  HPI Kristine White is a 56 y.o. female with extensive past medical history listed below including ovarian cancer with retroperitoneal fibrosis currently undergoing chemotherapy who presents to the emergency department with sudden onset of right upper quadrant and right lower chest pain.  Initially mild in gradually worsened throughout the evening yesterday.  Became severe.  Worse with deep breathing.  Patient took 2 Percocets which initially did not provide any relief.  She has associated nausea and shortness of breath due to the pain.  Since arriving the pain has subsided significantly.  Still has a shortness of breath and nausea.   She is s/p cholecystectomy  Abdominal Pain  Past Medical History Past Medical History:  Diagnosis Date   Asthma    Endometriosis    Family history of breast cancer    Family history of ovarian cancer    GERD (gastroesophageal reflux disease)    IBS (irritable bowel syndrome)    Idiopathic angioedema    Juvenile polyp of colon    Juvenile polyposis syndrome    Retroperitoneal fibrosis    Patient Active Problem List   Diagnosis Date Noted   Genetic testing 01/03/2022   Family history of breast cancer 12/11/2021   Family history of ovarian cancer 12/11/2021   Juvenile polyposis syndrome 12/11/2021   Bone pain 12/10/2021   Gastritis 12/10/2021   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 Authorizing Provider  acetaminophen (TYLENOL) 325 MG tablet Take 325 mg by mouth every 6 (six) hours as needed. 09/19/21   [provider]  Cholecalciferol 50 MCG (2000 UT) CAPS Take 2,000  Units by mouth daily.    [provider]  dexamethasone (DECADRON) 4 MG tablet Take 2 tabs at the night before chemotherapy, every 3 weeks, by mouth 12/10/21   Alvy Bimler, Ni, MD  EPINEPHrine 0.3 mg/0.3 mL IJ SOAJ injection Inject into the muscle. 06/19/14   [provider]  famotidine (PEPCID) 20 MG tablet Take 1 tablet (20 mg total) by mouth at bedtime. 12/10/21   Heath Lark, MD  gabapentin (NEURONTIN) 100 MG capsule Take 2 capsules (200 mg total) by mouth 2 (two) times daily. 11/21/21 11/21/22  Heath Lark, MD  lidocaine-prilocaine (EMLA) cream Apply topically. 10/17/21 10/17/22  [provider]  ondansetron (ZOFRAN) 8 MG tablet Take 1 tablet (8 mg total) by mouth 2 (two) times daily as needed for refractory nausea / vomiting. Start on day 3 after carboplatin chemo. 11/07/21   Heath Lark, MD  oxyCODONE (OXY IR/ROXICODONE) 5 MG immediate release tablet Take 1 tablet by mouth every 4 hours as needed for severe pain. 12/10/21   Heath Lark, MD  pantoprazole (PROTONIX) 40 MG tablet Take 1 tablet by mouth daily. 12/10/21   Heath Lark, MD  prochlorperazine (COMPAZINE) 10 MG tablet Take 1 tablet (10 mg total) by mouth every 6 (six) hours as needed (Nausea or vomiting). 11/07/21   Heath Lark, MD  Allergies Heparin  Review of Systems Review of Systems  Gastrointestinal:  Positive for abdominal pain.  As noted in HPI  Physical Exam Vital Signs  I have reviewed the triage vital signs BP (!) 133/91   Pulse (!) 106   Temp 98.6 F (37 C) (Oral)   Resp 18   SpO2 99%   Physical Exam Vitals reviewed.  Constitutional:      General: She is not in acute distress.    Appearance: She is well-developed. She is not diaphoretic.  HENT:     Head: Normocephalic and atraumatic.     Nose: Nose normal.  Eyes:     General: No scleral icterus.       Right eye: No  discharge.        Left eye: No discharge.     Conjunctiva/sclera: Conjunctivae normal.     Pupils: Pupils are equal, round, and reactive to light.  Cardiovascular:     Rate and Rhythm: Normal rate and regular rhythm.     Heart sounds: No murmur heard.   No friction rub. No gallop.  Pulmonary:     Effort: Pulmonary effort is normal. No respiratory distress.     Breath sounds: Normal breath sounds. No stridor. No rales.  Chest:     Chest wall: No tenderness.    Abdominal:     General: There is no distension.     Palpations: Abdomen is soft.     Tenderness: There is no abdominal tenderness.  Musculoskeletal:        General: No tenderness.     Cervical back: Normal range of motion and neck supple.  Skin:    General: Skin is warm and dry.     Findings: No erythema or rash.  Neurological:     Mental Status: She is alert and oriented to person, place, and time.    ED Results and Treatments Labs (all labs ordered are listed, but only abnormal results are displayed) Labs Reviewed  COMPREHENSIVE METABOLIC PANEL - Abnormal; Notable for the following components:      Result Value   Glucose, Bld 123 (*)    ALT 56 (*)    All other components within normal limits  CBC WITH DIFFERENTIAL/PLATELET - Abnormal; Notable for the following components:   RBC 3.44 (*)    Hemoglobin 10.5 (*)    HCT 30.8 (*)    RDW 16.2 (*)    All other components within normal limits  URINALYSIS, ROUTINE W REFLEX MICROSCOPIC - Abnormal; Notable for the following components:   APPearance HAZY (*)    Hgb urine dipstick SMALL (*)    Leukocytes,Ua TRACE (*)    All other components within normal limits  D-DIMER, QUANTITATIVE - Abnormal; Notable for the following components:   D-Dimer, Quant 2.80 (*)    All other components within normal limits  I-STAT BETA HCG BLOOD, ED ([facility], [facility], [facility] ONLY) - Abnormal; Notable for the following components:   I-stat hCG, quantitative 140.4 (*)    All other components within normal  limits  LIPASE, BLOOD  EKG  EKG Interpretation  Date/Time:  Tuesday Jan 07 2022 07:48:11 EDT Ventricular Rate:  106 PR Interval:  130 QRS Duration: 87 QT Interval:  327 QTC Calculation: 435 R Axis:   61 Text Interpretation: Sinus tachycardia Borderline repolarization abnormality No old tracing to compare Confirmed by Addison Lank [id number]) on 01/07/2022 7:53:56 AM       Radiology DG Chest Port 1 View  Result Date: 01/07/2022 CLINICAL DATA:  Chest pain.  Positive D-dimer. EXAM: PORTABLE CHEST 1 VIEW COMPARISON:  11/07/2021 radiographs FINDINGS: A RIGHT Port-A-Cath is again noted with tip overlying the SUPERIOR cavoatrial junction. The cardiomediastinal silhouette is unremarkable. Mild bibasilar hazy opacities are noted which may represent atelectasis versus airspace disease. There are probable trace bilateral pleural effusions present. There is no evidence of pneumothorax. No acute bony abnormalities are identified. IMPRESSION: Mild bibasilar hazy opacities which may represent atelectasis versus airspace disease. Probable trace bilateral pleural effusions. Electronically Signed   By: Margarette Canada M.D.   On: 01/07/2022 07:16    Pertinent labs & imaging results that were available during my care of the patient were reviewed by me and considered in my medical decision making (see MDM for details).  Medications Ordered in ED Medications  sodium chloride (PF) 0.9 % injection (has no administration in time range)  iohexol (OMNIPAQUE) 350 MG/ML injection 80 mL (80 mLs Intravenous Contrast Given 01/07/22 0729)  HYDROmorphone (DILAUDID) injection 0.5 mg (0.5 mg Intravenous Given 01/07/22 0744)  sodium chloride 0.9 % bolus 1,000 mL (1,000 mLs Intravenous New Bag/Given 01/07/22 0748)                                                                                                                                      Procedures .Critical Care Performed by: Fatima Blank, MD Authorized by: Fatima Blank, MD   Critical care provider statement:    Critical care time (minutes):  45   Critical care time was exclusive of:  Separately billable procedures and treating other patients   Critical care was necessary to treat or prevent imminent or life-threatening deterioration of the following conditions:  Respiratory failure   Critical care was time spent personally by me on the following activities:  Development of treatment plan with patient or surrogate, discussions with consultants, evaluation of patient's response to treatment, examination of patient, obtaining history from patient or surrogate, review of old charts, re-evaluation of patient's condition, pulse oximetry, ordering and review of radiographic studies, ordering and review of laboratory studies and ordering and performing treatments and interventions  (including critical care time)  Medical Decision Making / ED Course    Complexity of Problem:  Co-morbidities/SDOH that complicate the patient evaluation/care: Noted above in HPI  Patient's presenting problem/concern, DDX, and MDM listed below: Right upper quadrant/right lower chest pain Low suspicion for biliary disease Patient is nontender in the abdomen or the chest. Presentation would be most concerning for  pulmonary emboli. Patient opted for obtaining a dimer before CT scan. Will also assess for PNA or PTx. Low suspicion for ACS, aortic dissection, or esophageal perforation. Low suspicion for serious intra-abdominal inflammatory/infectious process but will follow-up labs and reconsider if they are significantly abnormal.  Hospitalization Considered:  Yes     Complexity of Data:   Cardiac Monitoring: The patient was maintained on a cardiac monitor.   I personally viewed and interpreted the cardiac monitored which showed an underlying  rhythm of sinus tachycardia EKG with sinus tachycardia.  No acute dysrhythmias.  No acute ischemic changes or evidence of pericarditis. Nonspecific TW changes are noted. No prior comparison.  Laboratory Tests ordered listed below with my independent interpretation: CBC without leukocytosis.  Relatively stable hemoglobin. CBC without significant electrolyte derangements or renal sufficiency.  No evidence of biliary obstruction or pancreatitis. Dimer elevated Ordered CT scan   Imaging Studies ordered listed below with my independent interpretation: Chest x-ray without evidence of pneumonia or pneumothorax.  Patient does have mild pleural effusion. CT notable for right sided PE. Possible bilateral. Pending radiology read to assess heart strain.     ED Course:    Assessment, Add'l Intervention, and Reassessment: Right sided pain Ruling right heart strain from PE on CTA Patient care turned over to oncoming provider. Patient case and results discussed in detail; please see their note for further ED managment.   Final Clinical Impression(s) / ED Diagnoses Final diagnoses:  Right-sided chest pain           This chart was dictated using voice recognition software.  Despite best efforts to proofread,  errors can occur which can change the documentation meaning.      Fatima Blank, MD 01/07/22 [phone]

## 2022-01-07 NOTE — ED Triage Notes (Signed)
Pt reports with RUQ pain since yesterday that radiates upwards.

## 2022-01-07 NOTE — H&P (Signed)
History and Physical    Patient: Kristine White DOB: 1965-12-10 DOA: 01/07/2022 DOS: the patient was seen and examined on 01/07/2022 PCP: Pcp, No  Patient coming from: Home  Chief Complaint:  Chief Complaint  Patient presents with   Abdominal Pain   right sided chest pain   HPI: Kristine White is a 56 y.o. female with medical history significant of asthma, endometriosis, family history of breast and ovarian cancer, GERD, IBS, idiopathic angioedema, juvenile polyposis syndrome, left ovarian epithelial cancer, alpha gal syndrome chemotherapy-induced neuropathy and chemotherapy-induced anemia, occasional palpitations after cancer treatment who is coming to the emergency department with complaints of developing right upper quadrant left lower chest wall pleuritic pain yesterday afternoon with side chest pain associated with lightheadedness, with no worsening of palpitations, dyspnea, diaphoresis, worsening of occasional nausea, PND, orthopnea, calf tenderness or pitting edema of the lower extremities.  No diarrhea, constipation, melena or hematochezia.  No flank pain, dysuria, frequency or hematuria.  No polyuria, polydipsia, polyphagia or blurred vision.  ED course: Initial vital signs were temperature 98.6 F, pulse 117, respiration 15, BP 109/87 mmHg O2 sat 94% on room air.  Lab work: Her urinalysis was hazy with small hemoglobinuria and trace leukocyte esterase.  CBC with a white count of 5.2, hemoglobin 10.5 g/dL and platelets 204.  D-dimer was 2.80.  Troponin was normal.  Lipase only 34 units/L.  CMP with a glucose of 123 mg/dL and ALT of 56 units/L, but were otherwise unremarkable.  Imaging: Portable 1 view chest radiograph showed mild bibasilar hazy opacities which may represent atelectasis versus airspace disease.  Probable trace bilateral pleural effusion. CTA chest showed right middle lobe pulmonary artery and segmental branches to the right and left lower lobes.  No  evidence of right heart strain.  There was atelectasis in the lung bases.  Small bilateral pulmonary nodules measuring up to 8 mm.  Review of Systems: As mentioned in the history of present illness. All other systems reviewed and are negative. Past Medical History:  Diagnosis Date   Asthma    Endometriosis    Family history of breast cancer    Family history of ovarian cancer    GERD (gastroesophageal reflux disease)    IBS (irritable bowel syndrome)    Idiopathic angioedema    Juvenile polyp of colon    Juvenile polyposis syndrome    Retroperitoneal fibrosis    Past Surgical History:  Procedure Laterality Date   ABDOMINAL HYSTERECTOMY     APPENDECTOMY     bile duct reconstruction     CHOLECYSTECTOMY     CHOLEDOCHOJEJUNOSTOMY     pancreatic duct surgery     POLYPECTOMY     TONSILLECTOMY     Social History:  reports that she has never smoked. She has never used smokeless tobacco. She reports that she does not currently use alcohol. No history on file for drug use.  Allergies  Allergen Reactions   Heparin Other (See Comments)    Mammalian meat/products, alpha gal syndrome. Is not aware she is allergic to other anti-coagulants    Family History  Problem Relation Age of Onset   Breast cancer Mother 28   Ovarian cancer Mother 41   Cancer Father        scrotal cancer   Lung cancer Father    Breast cancer Other    Kidney cancer Other    Breast cancer Half-Sister 52       Neg GT    Prior to Admission medications  Medication Sig Start Date End Date Taking? Authorizing Provider  acetaminophen (TYLENOL) 325 MG tablet Take 325 mg by mouth every 6 (six) hours as needed. 09/19/21   [provider]  Cholecalciferol 50 MCG (2000 UT) CAPS Take 2,000 Units by mouth daily.    [provider]  dexamethasone (DECADRON) 4 MG tablet Take 2 tabs at the night before chemotherapy, every 3 weeks, by mouth 12/10/21   Alvy Bimler, Ni, MD  EPINEPHrine 0.3 mg/0.3 mL IJ SOAJ  injection Inject into the muscle. 06/19/14   [provider]  famotidine (PEPCID) 20 MG tablet Take 1 tablet (20 mg total) by mouth at bedtime. 12/10/21   Heath Lark, MD  gabapentin (NEURONTIN) 100 MG capsule Take 2 capsules (200 mg total) by mouth 2 (two) times daily. 11/21/21 11/21/22  Heath Lark, MD  lidocaine-prilocaine (EMLA) cream Apply topically. 10/17/21 10/17/22  [provider]  ondansetron (ZOFRAN) 8 MG tablet Take 1 tablet (8 mg total) by mouth 2 (two) times daily as needed for refractory nausea / vomiting. Start on day 3 after carboplatin chemo. 11/07/21   Heath Lark, MD  oxyCODONE (OXY IR/ROXICODONE) 5 MG immediate release tablet Take 1 tablet by mouth every 4 hours as needed for severe pain. 12/10/21   Heath Lark, MD  pantoprazole (PROTONIX) 40 MG tablet Take 1 tablet by mouth daily. 12/10/21   Heath Lark, MD  prochlorperazine (COMPAZINE) 10 MG tablet Take 1 tablet (10 mg total) by mouth every 6 (six) hours as needed (Nausea or vomiting). 11/07/21   Heath Lark, MD    Physical Exam: Vitals:   01/07/22 0745 01/07/22 0830 01/07/22 0833 01/07/22 0915  BP: (!) 147/110 (!) 111/94  110/85  Pulse: (!) 113 96  91  Resp:  (!) 21  17  Temp:   98 F (36.7 C)   TempSrc:   Oral   SpO2: 100% 100%  100%   Physical Exam Vitals and nursing note reviewed.  Constitutional:      Appearance: She is well-developed.  HENT:     Head: Normocephalic.     Mouth/Throat:     Mouth: Mucous membranes are moist.  Eyes:     General: No scleral icterus.    Pupils: Pupils are equal, round, and reactive to light.  Neck:     Vascular: No JVD.  Cardiovascular:     Rate and Rhythm: Normal rate and regular rhythm.     Heart sounds: S1 normal and S2 normal.  Pulmonary:     Effort: Pulmonary effort is normal.     Breath sounds: Normal breath sounds. No wheezing, rhonchi or rales.  Abdominal:     General: Bowel sounds are normal.     Palpations: Abdomen is soft.     Tenderness: There is no  abdominal tenderness. There is no right CVA tenderness, left CVA tenderness, guarding or rebound.  Musculoskeletal:     Cervical back: Neck supple.     Right lower leg: No edema.     Left lower leg: No edema.  Skin:    General: Skin is warm and dry.  Neurological:     General: No focal deficit present.     Mental Status: She is alert and oriented to person, place, and time.  Psychiatric:        Mood and Affect: Mood normal.        Behavior: Behavior normal.    Data Reviewed:  Results are pending, will review when available.  Assessment and Plan: Principal Problem:  Bilateral pulmonary embolism (HCC) Observation/telemetry. Supplemental oxygen as needed. Analgesics as needed. Defer heparin due to Alpha gal Hx. Begin Eliquis per pharmacy. Check troponin level. Check echocardiogram.  Active Problems:   Atelectasis Incentive spirometry.    Pulmonary nodules OP follow-up with PCP and/or pulmonology.    Left ovarian epithelial cancer [facility]) Follow-up with oncology as scheduled.    Peripheral neuropathy due to chemotherapy (HCC) Continue gabapentin 200 mg p.o. twice daily. Analgesics as needed.    Elevated ALT measurement Repeat ALT measurement in the morning.    Hyperglycemia Check fasting glucose in AM.    Anemia associated with chemotherapy Monitor hematocrit and hemoglobin.    Advance Care Planning:   Code Status: Full Code   Consults:   Family Communication:   Severity of Illness: The appropriate patient status for this patient is OBSERVATION. Observation status is judged to be reasonable and necessary in order to provide the required intensity of service to ensure the patient's safety. The patient's presenting symptoms, physical exam findings, and initial radiographic and laboratory data in the context of their medical condition is felt to place them at decreased risk for further clinical deterioration. Furthermore, it is anticipated that the patient will be  medically stable for discharge from the hospital within 2 midnights of admission.   Author: Reubin Milan, MD 01/07/2022 9:54 AM  For on call review [email or web address].   This document was prepared using Par[employer] and may contain some unintended transcription errors.

## 2022-01-07 NOTE — Discharge Instructions (Addendum)
Information on my medicine - ELIQUIS (apixaban)  This medication education was reviewed with me or my healthcare representative as part of my discharge preparation.   WHY WAS ELIQUIS PRESCRIBED FOR YOU? Eliquis was prescribed to treat blood clots that may have been found in the veins of your legs (deep vein thrombosis) or in your lungs (pulmonary embolism) and to reduce the risk of them occurring again.  WHAT DO YOU NEED TO KNOW ABOUT ELIQUIS ? The starting dose is 10 mg (two 5 mg tablets) taken TWICE daily for the FIRST SEVEN (7) DAYS, then on 01/14/22 the dose is reduced to ONE 5 mg tablet taken TWICE daily. Eliquis may be taken with or without food. Try to take the dose about the same time in the morning and in the evening. If you have difficulty swallowing the tablet whole please discuss with your pharmacist how to take the medication safely. Take Eliquis exactly as prescribed and DO NOT stop taking Eliquis without talking to the doctor who prescribed the medication. Stopping may increase your risk of developing a new blood clot. Refill your prescription before you run out. After discharge, you should have regular check-up appointments with your healthcare provider that is prescribing your Eliquis.  WHAT DO YOU DO IF YOU MISS A DOSE? If a dose of ELIQUIS is not taken at the scheduled time, take it as soon as possible on the same day and twice-daily administration should be resumed. The dose should not be doubled to make up for a missed dose.  IMPORTANT SAFETY INFORMATION A possible side effect of Eliquis is bleeding. You should call your healthcare provider right away if you experience any of the following: ? Bleeding from an injury or your nose that does not stop. ? Unusual colored urine (red or dark brown) or unusual colored stools (red or black). ? Unusual bruising for unknown reasons. ? A serious fall or if you hit your head (even if there is no bleeding). Some medicines may  interact with Eliquis and might increase your risk of bleeding or clotting while on Eliquis. To help avoid this, consult your healthcare provider or pharmacist prior to using any new prescription or non-prescription medications, including herbals, vitamins, non-steroidal anti-inflammatory drugs (NSAIDs) and supplements.  This website has more information on Eliquis (apixaban): [email or web address].

## 2022-01-07 NOTE — ED Notes (Addendum)
Labs will be collected in back

## 2022-01-07 NOTE — ED Provider Notes (Signed)
Care of patient assumed from Dr. Jeris Penta at 7:30 AM.  This patient presents for right upper quadrant/right lower anterior chest wall discomfort.  Pain is pleuritic and D-dimer is elevated.  CTA is pending.  Follow-up on results of CTA and reassess. Physical Exam  BP (!) 133/91   Pulse (!) 106   Temp 98.6 F (37 C) (Oral)   Resp 13   SpO2 99%   Physical Exam Vitals and nursing note reviewed.  Constitutional:      General: She is not in acute distress.    Appearance: She is well-developed. She is ill-appearing. She is not toxic-appearing or diaphoretic.  HENT:     Head: Normocephalic and atraumatic.     Mouth/Throat:     Mouth: Mucous membranes are moist.     Pharynx: Oropharynx is clear.  Eyes:     Conjunctiva/sclera: Conjunctivae normal.     Pupils: Pupils are equal, round, and reactive to light.  Cardiovascular:     Rate and Rhythm: Normal rate and regular rhythm.  Pulmonary:     Effort: Pulmonary effort is normal. No respiratory distress.  Abdominal:     General: Abdomen is flat.  Musculoskeletal:        General: No swelling.     Cervical back: Neck supple.  Skin:    General: Skin is warm and dry.  Neurological:     General: No focal deficit present.     Mental Status: She is alert and oriented to person, place, and time.  Psychiatric:        Mood and Affect: Mood normal.        Behavior: Behavior normal.    Procedures  Procedures  ED Course / MDM    Medical Decision Making Amount and/or Complexity of Data Reviewed Labs: ordered. Radiology: ordered.  Risk Prescription drug management. Decision regarding hospitalization.   CTA shows bilateral PEs.  On assessment, patient resting in bed.  She endorses continued right sided chest pain.  Additional Dilaudid was ordered.  She remains normotensive with some persistent tachycardia.  SPO2 remains normal on room air.  She has a listed allergy to heparin.  This is due to an alpha gal diagnosis.  She denies any history  of HIT.  Anticoagulation options discussed with hospitalist and pharmacy.  Decision of which AC agent to initiate deferred to admitting hospitalist.  Patient was admitted for further management.       Godfrey Pick, MD 01/07/22 [id number]

## 2022-01-07 NOTE — Plan of Care (Signed)
  Problem: Health Behavior/Discharge Planning: Goal: Ability to manage health-related needs will improve Outcome: Progressing   Problem: Activity: Goal: Risk for activity intolerance will decrease Outcome: Progressing   Problem: Nutrition: Goal: Adequate nutrition will be maintained Outcome: Progressing   Problem: Pain Managment: Goal: General experience of comfort will improve Outcome: Progressing   

## 2022-01-07 NOTE — Progress Notes (Signed)
ANTICOAGULATION CONSULT NOTE - Initial Consult  Pharmacy Consult for Eliquis Indication: pulmonary embolus  Allergies  Allergen Reactions   Heparin Other (See Comments)    Mammalian meat/products, alpha gal syndrome. Is not aware she is allergic to other anti-coagulants     Vital Signs: Temp: 98 F (36.7 C) (05/23 0833) Temp Source: Oral (05/23 0833) BP: 111/94 (05/23 0830) Pulse Rate: 96 (05/23 0830)  Labs: Recent Labs    01/07/22 0408  HGB 10.5*  HCT 30.8*  PLT 204  CREATININE 0.70    Estimated Creatinine Clearance: 71.6 mL/min (by C-G formula based on SCr of 0.7 mg/dL).   Medical History: Past Medical History:  Diagnosis Date   Asthma    Endometriosis    Family history of breast cancer    Family history of ovarian cancer    GERD (gastroesophageal reflux disease)    IBS (irritable bowel syndrome)    Idiopathic angioedema    Juvenile polyp of colon    Juvenile polyposis syndrome    Retroperitoneal fibrosis       Assessment: 56 year old female presented with chest pain. Patient with ovarian cancer undergoing chemotherapy. CTA chest positive acute pulmonary embolus in the right middle lobe pulmonary artery and segmental branches to the right and left lower lobes. No evidence for right heart strain. Patient with alpha-gal allergy, unable to start on heparin products. Pharmacy consult to start Eliquis.   Plan:  -Eliquis 10 mg BID x 7 days followed by 5 mg BID -Monitor for s/sx of bleeding, CBC per protocol  Tawnya Crook, PharmD, BCPS Clinical Pharmacist 01/07/2022 9:23 AM

## 2022-01-07 NOTE — ED Notes (Signed)
Patient states she does not want her port accessed

## 2022-01-08 ENCOUNTER — Encounter (HOSPITAL_COMMUNITY): Payer: Self-pay | Admitting: Internal Medicine

## 2022-01-08 DIAGNOSIS — R739 Hyperglycemia, unspecified: Secondary | ICD-10-CM

## 2022-01-08 DIAGNOSIS — R7401 Elevation of levels of liver transaminase levels: Secondary | ICD-10-CM | POA: Diagnosis not present

## 2022-01-08 DIAGNOSIS — T451X5A Adverse effect of antineoplastic and immunosuppressive drugs, initial encounter: Secondary | ICD-10-CM

## 2022-01-08 DIAGNOSIS — I2699 Other pulmonary embolism without acute cor pulmonale: Secondary | ICD-10-CM | POA: Diagnosis not present

## 2022-01-08 DIAGNOSIS — R918 Other nonspecific abnormal finding of lung field: Secondary | ICD-10-CM | POA: Diagnosis not present

## 2022-01-08 DIAGNOSIS — D6481 Anemia due to antineoplastic chemotherapy: Secondary | ICD-10-CM

## 2022-01-08 LAB — HIV ANTIBODY (ROUTINE TESTING W REFLEX): HIV Screen 4th Generation wRfx: NONREACTIVE

## 2022-01-08 MED ORDER — APIXABAN (ELIQUIS) VTE STARTER PACK (10MG AND 5MG): ORAL_TABLET | ORAL | 0 refills | Status: DC

## 2022-01-08 NOTE — Plan of Care (Signed)

## 2022-01-08 NOTE — Progress Notes (Signed)
  Transition of Care [facility]) Screening Note   Patient Details  Name: Kristine White Date of Birth: 02/17/1966   Transition of Care [facility]) CM/SW Contact:    Dessa Phi, RN Phone Number: 01/08/2022, 3:13 PM    Transition of Care Department [facility]) has reviewed patient and no TOC needs have been identified at this time. We will continue to monitor patient advancement through interdisciplinary progression rounds. If new patient transition needs arise, please place a TOC consult.

## 2022-01-08 NOTE — Discharge Summary (Signed)
Physician Discharge Summary   Patient: Kristine White MRN: [id number] DOB: 1965-09-16  Admit date:     01/07/2022  Discharge date: 01/08/22  Discharge Physician: Murray Hodgkins   PCP: Pcp, No   Recommendations at discharge:   Bilateral pulmonary embolism ([place]) --Uncomplicated, no evidence of heart strain, echocardiogram unremarkable.  No hypoxia.  Has avoided pain medication. --Apixaban --Follow-up with Dr. Alvy Bimler   Pulmonary nodules --recommend OP follow-up with Dr. Alvy Bimler and/or pulmonology.   Elevated ALT measurement --mild, seen in past, can follow-up as an outpatient. Significance unclear.  Hyperglycemia --Follow-up with Dr. 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  Admitted for bilateral pulmonary embolism.  Further evaluation was unremarkable, hospitalization uncomplicated and patient was discharged home in good condition.  Note that Dopplers were not ordered on admission and given the patient's desire for discharge home, and the fact she had no lower extremity symptoms, edema or complaints, Dopplers would not change management.  She will follow-up with Dr. Alvy Bimler as an outpatient.  Bilateral pulmonary embolism (HCC) --Uncomplicated, no evidence of heart strain, echocardiogram unremarkable.  No hypoxia.  Has avoided pain medication. --Apixaban --Follow-up with Dr.  Alvy Bimler   Pulmonary nodules --recommend OP follow-up with Dr. Alvy Bimler and/or pulmonology.   Left ovarian epithelial cancer [facility]) --keep follow-up with oncology as scheduled.   Peripheral neuropathy due to chemotherapy (HCC) --Continue gabapentin 200 mg p.o. twice daily.  Elevated ALT measurement --mild, seen in past, can follow-up as an outpatient. Significance unclear.  Hyperglycemia --Follow-up with Dr. Alvy Bimler, thought secondary to steroids   Anemia associated with chemotherapy --stable      Consultants: none Procedures performed: none  Disposition: Home Diet recommendation:  Discharge Diet Orders (From admission, onward)     Start     Ordered   01/08/22 0000  Diet general        01/08/22 1639           Regular diet DISCHARGE MEDICATION: Allergies as of 01/08/2022       Reactions   Heparin Other (See Comments)   Mammalian meat/products, alpha gal syndrome. Is not aware she is allergic to other anti-coagulants   Beef-derived Products Other (See Comments)   Alpha Gal Syndrome   Pork-derived Products Other (See Comments)   Alpha Gal Syndrome        Medication List     STOP taking these medications    ibuprofen 600 MG tablet Commonly known as: ADVIL       TAKE these medications    acetaminophen 500 MG tablet Commonly known as: TYLENOL Take 1,000 mg by mouth every 6 (six) hours as needed for mild pain.   Apixaban Starter Pack ('10mg'$  and '5mg'$ ) Commonly known as: ELIQUIS STARTER PACK Take as directed on package: start with two-'5mg'$  tablets twice daily for 7 days. On day 8, switch to one-'5mg'$  tablet twice daily.   dexamethasone 4 MG tablet Commonly known as: DECADRON Take 2 tabs at the night before chemotherapy, every 3  weeks, by mouth What changed:  how much to take how to take this when to take this additional instructions   EPINEPHrine 0.3 mg/0.3 mL Soaj injection Commonly known as: EPI-PEN Inject into the muscle.   famotidine 20 MG  tablet Commonly known as: PEPCID Take 1 tablet (20 mg total) by mouth at bedtime. What changed:  when to take this reasons to take this   gabapentin 100 MG capsule Commonly known as: NEURONTIN Take 2 capsules (200 mg total) by mouth 2 (two) times daily.   lidocaine-prilocaine cream Commonly known as: EMLA Apply 1 application. topically See admin instructions. Apply as needed for port access   ondansetron 8 MG tablet Commonly known as: Zofran Take 1 tablet (8 mg total) by mouth 2 (two) times daily as needed for refractory nausea / vomiting. Start on day 3 after carboplatin chemo.   oxyCODONE 5 MG immediate release tablet Commonly known as: Oxy IR/ROXICODONE Take 1 tablet by mouth every 4 hours as needed for severe pain.   pantoprazole 40 MG tablet Commonly known as: Protonix Take 1 tablet by mouth daily.   polyethylene glycol powder 17 GM/SCOOP powder Commonly known as: GLYCOLAX/MIRALAX Take 17 g by mouth daily as needed for mild constipation.   prochlorperazine 10 MG tablet Commonly known as: COMPAZINE Take 1 tablet (10 mg total) by mouth every 6 (six) hours as needed (Nausea or vomiting). What changed: reasons to take this   traZODone 50 MG tablet Commonly known as: DESYREL Take 25 mg by mouth at bedtime as needed for sleep.   valACYclovir 500 MG tablet Commonly known as: VALTREX Take 2,000 mg by mouth See admin instructions. Loading dose of 2,000 mg BID for 1 day PRN cold sore.        Follow-up Information     Heath Lark, MD Follow up.   Specialty: Hematology and Oncology Why: Office will contact you with an appointment Contact information: [place] [id number] [phone]                Feels better, some pain but can tolerate it Breathing fine Up to bathroom without difficulty No leg swelling  Discharge Exam: Filed Weights   01/08/22 0640  Weight: 70.6 kg   Physical Exam Vitals reviewed.  Constitutional:       General: She is not in acute distress.    Appearance: She is not ill-appearing or toxic-appearing.  Cardiovascular:     Rate and Rhythm: Normal rate and regular rhythm.     Heart sounds: No murmur heard. Pulmonary:     Effort: Pulmonary effort is normal. No respiratory distress.     Breath sounds: No wheezing, rhonchi or rales.  Musculoskeletal:        General: No tenderness (BLE).     Right lower leg: No edema.     Left lower leg: No edema.  Neurological:     Mental Status: She is alert.  Psychiatric:        Behavior: Behavior normal.     Condition at discharge: good  The results of significant diagnostics from this hospitalization (including imaging, microbiology, ancillary and laboratory) are listed below for reference.   Imaging Studies: CT Angio Chest PE W and/or Wo Contrast  Result Date: 01/07/2022 CLINICAL DATA:  Ovarian cancer with shortness of breath since yesterday and positive D-dimer. EXAM: CT ANGIOGRAPHY CHEST WITH CONTRAST TECHNIQUE: Multidetector CT imaging of the chest was performed using the standard protocol during bolus administration of intravenous contrast. Multiplanar  CT image reconstructions and MIPs were obtained to evaluate the vascular anatomy. RADIATION DOSE REDUCTION: This exam was performed according to the departmental dose-optimization program which includes automated exposure control, adjustment of the mA and/or kV according to patient size and/or use of iterative reconstruction technique. CONTRAST:  36m OMNIPAQUE IOHEXOL 350 MG/ML SOLN COMPARISON:  None Available. FINDINGS: Cardiovascular: The heart size is normal. No substantial pericardial effusion. No thoracic aortic aneurysm. Right Port-A-Cath tip is positioned in the distal SVC. Acute pulmonary embolus identified in the right middle lobe pulmonary artery and segmental branches to the right lower lobe. Nonocclusive thrombus identified in proximal segmental branches to the left lower lobe. RV/LV  ratio is 0.78. Mediastinum/Nodes: No mediastinal lymphadenopathy. There is no hilar lymphadenopathy. The esophagus has normal imaging features. There is no axillary lymphadenopathy. Lungs/Pleura: Atelectasis noted in the lung bases. Component of pulmonary infarct in the right lower lobe not excluded. 8 mm perifissural nodule noted left lower lobe on image 71/7. 4 mm right upper lobe nodule identified on 35/7. No substantial pleural effusion. Upper Abdomen: Pneumobilia has been incompletely visualized in this patient with a history choledocho jejunostomy. Hypoattenuating lesions in the liver measure up to 2.5 cm in the left hepatic lobe, potentially cysts. Musculoskeletal: No worrisome lytic or sclerotic osseous abnormality. Review of the MIP images confirms the above findings. IMPRESSION: 1. Acute pulmonary embolus in the right middle lobe pulmonary artery and segmental branches to the right and left lower lobes. No evidence for right heart strain. RV/LV ratio is 0.78. 2. Atelectasis in the lung bases. Component of pulmonary infarct in the right lower lobe not excluded. 3. Small bilateral pulmonary nodules measuring up to 8 mm. Critical Value/emergent results were called by telephone at the time of interpretation on 01/07/2022 at 7:54 am [facility] verbally acknowledged these results. Electronically Signed   By: EMisty StanleyM.D.   On: 01/07/2022 07:56   DG Chest Port 1 View  Result Date: 01/07/2022 CLINICAL DATA:  Chest pain.  Positive D-dimer. EXAM: PORTABLE CHEST 1 VIEW COMPARISON:  11/07/2021 radiographs FINDINGS: A RIGHT Port-A-Cath is again noted with tip overlying the SUPERIOR cavoatrial junction. The cardiomediastinal silhouette is unremarkable. Mild bibasilar hazy opacities are noted which may represent atelectasis versus airspace disease. There are probable trace bilateral pleural effusions present. There is no evidence of pneumothorax. No acute bony abnormalities are identified.  IMPRESSION: Mild bibasilar hazy opacities which may represent atelectasis versus airspace disease. Probable trace bilateral pleural effusions. Electronically Signed   By: JMargarette CanadaM.D.   On: 01/07/2022 07:16   ECHOCARDIOGRAM COMPLETE  Result Date: 01/07/2022    ECHOCARDIOGRAM REPORT   Patient Name:   CJAELIANA LOCOCODate of Exam: 01/07/2022 Medical Rec #:  [id number]      Height:       63.0 in Accession #:    [id number]     Weight:       153.1 lb Date of Birth:  4Oct 08, 1967      BSA:          1.726 m Patient Age:    89years        BP:           124/86 mmHg Patient Gender: F               HR:           92 bpm. Exam Location:  Inpatient Procedure: 2D Echo, Cardiac Doppler and Color Doppler Indications:  Pulmonary embolus  History:        Patient has no prior history of Echocardiogram examinations.  Sonographer:    Jefferey Pica Referring Phys: [id number] [place]  1. Left ventricular ejection fraction, by estimation, is 60 to 65%. The left ventricle has normal function. The left ventricle has no regional wall motion abnormalities. Left ventricular diastolic parameters were normal.  2. Right ventricular systolic function is normal. The right ventricular size is normal. There is normal pulmonary artery systolic pressure. The estimated right ventricular systolic pressure is 37.6 mmHg.  3. The mitral valve is normal in structure. No evidence of mitral valve regurgitation. No evidence of mitral stenosis.  4. The aortic valve is tricuspid. Aortic valve regurgitation is not visualized.  5. The inferior vena cava is normal in size with greater than 50% respiratory variability, suggesting right atrial pressure of 3 mmHg. Comparison(s): No prior Echocardiogram. FINDINGS  Left Ventricle: Left ventricular ejection fraction, by estimation, is 60 to 65%. The left ventricle has normal function. The left ventricle has no regional wall motion abnormalities. The left ventricular internal cavity size was  normal in size. There is  no left ventricular hypertrophy. Left ventricular diastolic parameters were normal. Right Ventricle: The right ventricular size is normal. No increase in right ventricular wall thickness. Right ventricular systolic function is normal. There is normal pulmonary artery systolic pressure. The tricuspid regurgitant velocity is 2.64 m/s, and  with an assumed right atrial pressure of 3 mmHg, the estimated right ventricular systolic pressure is 28.3 mmHg. Left Atrium: Left atrial size was normal in size. Right Atrium: Right atrial size was normal in size. Pericardium: There is no evidence of pericardial effusion. Mitral Valve: The mitral valve is normal in structure. No evidence of mitral valve regurgitation. No evidence of mitral valve stenosis. Tricuspid Valve: The tricuspid valve is normal in structure. Tricuspid valve regurgitation is mild . No evidence of tricuspid stenosis. Aortic Valve: The aortic valve is tricuspid. Aortic valve regurgitation is not visualized. Aortic valve peak gradient measures 8.2 mmHg. Pulmonic Valve: The pulmonic valve was not well visualized. Pulmonic valve regurgitation is not visualized. No evidence of pulmonic stenosis. Aorta: The aortic root and ascending aorta are structurally normal, with no evidence of dilitation. Venous: The inferior vena cava is normal in size with greater than 50% respiratory variability, suggesting right atrial pressure of 3 mmHg. IAS/Shunts: No atrial level shunt detected by color flow Doppler.  LEFT VENTRICLE PLAX 2D LVIDd:         4.30 cm   Diastology LVIDs:         3.00 cm   LV e' medial:    7.88 cm/s LV PW:         1.00 cm   LV E/e' medial:  8.8 LV IVS:        1.00 cm   LV e' lateral:   13.20 cm/s LVOT diam:     2.00 cm   LV E/e' lateral: 5.2 LV SV:         65 LV SV Index:   38 LVOT Area:     3.14 cm  RIGHT VENTRICLE             IVC RV Basal diam:  3.10 cm     IVC diam: 2.00 cm RV Mid diam:    2.80 cm RV S prime:     15.40 cm/s TAPSE  (M-mode): 2.5 cm LEFT ATRIUM  Index        RIGHT ATRIUM           Index LA diam:        2.60 cm 1.51 cm/m   RA Area:     15.40 cm LA Vol (A2C):   42.2 ml 24.45 ml/m  RA Volume:   39.60 ml  22.94 ml/m LA Vol (A4C):   41.5 ml 24.04 ml/m LA Biplane Vol: 44.0 ml 25.49 ml/m  AORTIC VALVE                 PULMONIC VALVE AV Area (Vmax): 2.50 cm     PV Vmax:       0.70 m/s AV Vmax:        143.00 cm/s  PV Peak grad:  2.0 mmHg AV Peak Grad:   8.2 mmHg LVOT Vmax:      114.00 cm/s LVOT Vmean:     73.100 cm/s LVOT VTI:       0.208 m  AORTA Ao Root diam: 3.10 cm Ao Asc diam:  2.90 cm MITRAL VALVE               TRICUSPID VALVE MV Area (PHT): 3.85 cm    TR Peak grad:   27.9 mmHg MV Decel Time: 197 msec    TR Vmax:        264.00 cm/s MV E velocity: 69.00 cm/s MV A velocity: 49.70 cm/s  SHUNTS MV E/A ratio:  1.39        Systemic VTI:  0.21 m                            Systemic Diam: 2.00 cm Rudean Haskell MD Electronically signed by Rudean Haskell MD Signature Date/Time: 01/07/2022/2:02:22 PM    Final     Microbiology: No results found for this or any previous visit.  Labs: CBC: Recent Labs  Lab 01/07/22 0408  WBC 5.2  NEUTROABS 3.8  HGB 10.5*  HCT 30.8*  MCV 89.5  PLT 888   Basic Metabolic Panel: Recent Labs  Lab 01/07/22 0408  NA 137  K 4.0  CL 106  CO2 24  GLUCOSE 123*  BUN 18  CREATININE 0.70  CALCIUM 8.9   Liver Function Tests: Recent Labs  Lab 01/07/22 0408  AST 26  ALT 56*  ALKPHOS 73  BILITOT 0.5  PROT 6.8  ALBUMIN 3.9   CBG: No results for input(s): GLUCAP in the last 168 hours.  Discharge time spent: greater than 30 minutes.  Signed: Murray Hodgkins, MD [facility] 01/08/2022

## 2022-01-08 NOTE — Hospital Course (Signed)
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 Supplemental oxygen as needed. Analgesics as needed. Defer heparin due to Alpha gal Hx. Begin Eliquis per pharmacy. Check troponin level. Check echocardiogram.   Active Problems:   Atelectasis Incentive spirometry.     Pulmonary nodules OP follow-up with PCP and/or pulmonology.     Left ovarian epithelial cancer [facility]) Follow-up with oncology as scheduled.     Peripheral neuropathy due to chemotherapy (HCC) Continue gabapentin 200 mg p.o. twice daily. Analgesics as needed.     Elevated ALT measurement Repeat ALT measurement in the morning.     Hyperglycemia Check fasting glucose in AM.     Anemia associated with chemotherapy Monitor hematocrit and hemoglobin.

## 2022-01-10 ENCOUNTER — Telehealth: Payer: Self-pay

## 2022-01-10 NOTE — Telephone Encounter (Signed)
Checked-in with patient regarding recent hospitalization for PE. Patient reports feeling fatigued and still experiencing some pain. Patient reports that pain has improved since hospitalization.  Patient informed that Dr. Alvy Bimler will be managing her eliquis and following up with her.  Patient offered earlier follow-up appointment to address additional concerns prior to her next chemo treatment. Patient scheduled for 6/1 at 9:20 am for video visit. All questions answered during call.

## 2022-01-15 ENCOUNTER — Telehealth: Payer: Self-pay | Admitting: Oncology

## 2022-01-15 NOTE — Telephone Encounter (Signed)
Kristine White called and said she started having the same pain that she had in her right upper quadrant radiating to her back and around her port area that she had when diagnosed with PE's.  She rates it at a 3 out of 10.  She denies feeling short of breath but does have discomfort when taking a deep breath.  She is taking Eliquis and started the maintenance dose of 5 mg BID today.  She is out of town in [place] on vacation with her family and will be back in town on Sunday.    Reviewed with Dr. Alvy Bimler and advised patient to try aspirin 81 mg for pleuritic chest pain and discussed that it may slightly increase her risk of bleeding but would be relatively safe.  Advised if her pain is not resolving, she should be evaluated at a local urgent care or ER.

## 2022-01-16 ENCOUNTER — Encounter: Payer: Self-pay | Admitting: Hematology and Oncology

## 2022-01-16 ENCOUNTER — Inpatient Hospital Stay: Payer: 59 | Attending: Hematology and Oncology | Admitting: Hematology and Oncology

## 2022-01-16 DIAGNOSIS — C562 Malignant neoplasm of left ovary: Secondary | ICD-10-CM | POA: Diagnosis not present

## 2022-01-16 DIAGNOSIS — Z7901 Long term (current) use of anticoagulants: Secondary | ICD-10-CM | POA: Insufficient documentation

## 2022-01-16 DIAGNOSIS — Z79899 Other long term (current) drug therapy: Secondary | ICD-10-CM | POA: Insufficient documentation

## 2022-01-16 DIAGNOSIS — Z86711 Personal history of pulmonary embolism: Secondary | ICD-10-CM | POA: Insufficient documentation

## 2022-01-16 DIAGNOSIS — Z7952 Long term (current) use of systemic steroids: Secondary | ICD-10-CM | POA: Insufficient documentation

## 2022-01-16 DIAGNOSIS — Z9221 Personal history of antineoplastic chemotherapy: Secondary | ICD-10-CM | POA: Insufficient documentation

## 2022-01-16 DIAGNOSIS — I2699 Other pulmonary embolism without acute cor pulmonale: Secondary | ICD-10-CM

## 2022-01-16 NOTE — Assessment & Plan Note (Signed)
She developed PE, likely precipitated by reduced mobility and possible dehydration after chemotherapy, in the setting of ongoing treatment Her pleuritic chest pain has improved with addition of aspirin and oxycodone as needed She will need to be on anticoagulation therapy for minimum 6 months She have no signs or symptoms of bleeding so far

## 2022-01-16 NOTE — Assessment & Plan Note (Signed)
She tolerated last cycle of treatment poorly despite dose adjustment Moving forward, we will either discontinue chemotherapy altogether or discontinue Taxol and proceed with single agent carboplatin only We reviewed this next week

## 2022-01-16 NOTE — Progress Notes (Signed)
HEMATOLOGY-ONCOLOGY ELECTRONIC VISIT PROGRESS NOTE  Patient Care Team: Pcp, No as PCP - General  I connected with the patient via telephone conference and verified that I am speaking with the correct person using two identifiers. The patient's location is at home and I am providing care from the [facility] I discussed the limitations, risks, security and privacy concerns of performing an evaluation and management service by e-visits and the availability of in person appointments.  I also discussed with the patient that there may be a patient responsible charge related to this service. The patient expressed understanding and agreed to proceed.   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[place]) She tolerated last cycle of treatment poorly despite dose adjustment Moving forward, we will either discontinue chemotherapy altogether or discontinue Taxol and proceed with single agent carboplatin only We reviewed this next week  Bilateral pulmonary embolism ([place]) She developed PE, likely precipitated by reduced mobility and possible dehydration after chemotherapy, in the setting of ongoing treatment Her pleuritic chest pain has improved with addition of aspirin and oxycodone as needed She will need to be on anticoagulation therapy for minimum 6 months She have no signs or symptoms of bleeding so far  No orders of the defined types were placed in this encounter.   INTERVAL HISTORY: Please see below for problem oriented charting. The purpose of today's discussion is to review recent hospitalization for bilateral PE and ongoing pleuritic chest pain Yesterday, she have severe chest pain It was alleviated after she took multiple doses of oxycodone and 81 mg aspirin, in addition to her anticoagulation treatment Looking back, she felt awful after her last cycle of treatment She might be dehydrated although she is mindful of drinking adequate fluids Definitely, she felt she has reduced  mobility when she was unwell after chemotherapy She have a lot of questions related to duration of treatment, whether she might have reduce pulmonary function after PE and expressed concern over her ability to complete 2 more cycles of adjuvant treatment  SUMMARY OF ONCOLOGIC HISTORY: Oncology History Overview Note  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 She has never undergone biopsy of the retroperitoneal tissue and its been historically activated to her multiple abdominal surgeries. MRI scan on 06/2020 showed fatty infiltration of the retroperitoneal space surrounding the aorta and IVC to the bifurcation with subcentimeter retroperitoneal nodes that appeared stable to marginally improved from prior CT, consistent with retroperitoneal fibrosis. At her most recent visit in November, she reported that she was overall doing well, continues to have chronic intermittent right upper quadrant abdominal pain. She had a repeat CT scan in November 2022 which showed an abnormal appearance of the left ovary with somewhat masslike appearance and ill-defined margins. She then had a follow-up ultrasound in November 2022 that showed a moderately lobulated mass within the left adnexa that was solid with mild peripheral and central vascularity, measuring about 4 cm, O-RADS 4, no findings of ascites. She has a history of endometriosis (s/p [facility] ovarian cystectomies for several endometriomas and is s/p [facility] hysterectomy).   07/09/2021 Imaging   Ct abdomen and pelvis Abnormal appearance of the left ovary with somewhat mass like appearance and ill defined margins, possibly neoplasm.  Recommend pelvic ultrasound for further evaluation.   Infiltrative changes of the retroperitoneum, similar compared with previous examination, consistent with known retroperitoneal  fibrosis  09/19/2021 Pathology Results   A. Ovary and fallopian tube, left, salpingo-oophorectomy - Endometrioid adenocarcinoma, seromucinous type, FIGO grade 1, present as multiple foci of invasive carcinoma arising in a background of serousmucinous borderline tumor, see comment and synoptic report -Ovarian surface involvement is identified -Left fallopian tube with focal endometriosis and focal foreign body giant cell reaction consistent with prior surgical intervention, no involvement by tumor identified   B.  Ovary and Fallopian tube, right, salpingo-oophorectomy -Serous cystadenoma, 1.7 cm, right ovary  -Background ovary with focal endometriosis and surface adhesions -Right fallopian tube with serosal adhesions -No involvement by tumor is identified   C.  Omental biopsy -Benign adipose tissue, no involvement by tumor identified   09/19/2021 Surgery   Procedures: Robotic assisted bilateral salpingo-oophorectomy (CPT 2262561524), omental biopsy, bilateral ureterolysis (CPT 62703), lysis of adhesions  Surgeon: Cindie Laroche, MD  Assistants: Bernadene Bell, MD - Fellow, Deeann Cree, MD - Resident Findings: On entry to abdomen, dense adhesions of the omentum to the anterior abdominal wall, unable to visualize upper abdomen, specifically unable to visualize the diaphragm, liver, stomach. Omentum grossly normal apart from adhesions. Entry site inspected with no trauma to surrounding structures. Bilateral complex ovarian masses, left approximately 4cm and right 3cm. Bilateral ovarian masses densely adherent to the pelvic sidewalls, and the right mass adherent to the mesentery of the rectum. Uterus and cervix surgically absent. Retroperitoneal fibrosis bilaterally requiring ureterolysis from the pelvic brim to the insertion into the bladder bilaterally. IOFS c/w borderline tumor.   Specimens:  ID Type Source Tests Collected by Time Destination  1 : left tube and ovary Tissue Ovary, Left SURGICAL  PATHOLOGY EXAM Bernadene Bell, MD 09/19/2021 [phone]  2 : pelvic washing Washing Pelvic cavity CYTOLOGY - EXAM FROM OPTIME Christella Hartigan, MD 09/19/2021 [phone]  3 : right ovary and tube Tissue Ovary, Right SURGICAL PATHOLOGY EXAM Christella Hartigan, MD 09/19/2021 1012  4 : omental biopsy Tissue Omentum SURGICAL PATHOLOGY EXAM Christella Hartigan, MD 09/19/2021 1012    10/29/2021 Procedure   Successful placement of a single lumen Power port catheter in the right internal jugular vein under ultrasound and fluoroscopy at [facility]   10/30/2021 -  Chemotherapy   She received first cycle of carboplatin and taxol at [facility]   11/04/2021 Initial Diagnosis   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 Right-sided venous Port-A-Cath positioning, as described above. 2. No acute or active cardiopulmonary disease.   11/19/2021 -  Chemotherapy   Patient is on Treatment Plan : OVARIAN Carboplatin (AUC 6) / Paclitaxel (175) q21d x 6 cycles      01/02/2022 Genetic Testing   Negative genetic testing on the cancerNext-Expanded+RNAinsight panel.  The report date is Jan 02, 2022.  The CancerNext-Expanded gene panel offered by [facility] and includes sequencing and rearrangement analysis for the following 77 genes: AIP, ALK, APC*, ATM*, AXIN2, BAP1, BARD1, BLM, BMPR1A, BRCA1*, BRCA2*, BRIP1*, CDC73, CDH1*, CDK4, CDKN1B, CDKN2A, CHEK2*, CTNNA1, DICER1, FANCC, FH, FLCN, GALNT12, KIF1B, LZTR1, MAX, MEN1, MET, MLH1*, MSH2*, MSH3, MSH6*, MUTYH*, NBN, NF1*, NF2, NTHL1, PALB2*, PHOX2B, PMS2*, POT1, PRKAR1A, PTCH1, PTEN*, RAD51C*, RAD51D*, RB1, RECQL, RET, SDHA, SDHAF2, SDHB, SDHC, SDHD, SMAD4, SMARCA4, SMARCB1, SMARCE1, STK11,  SUFU, TMEM127,  TP53*, TSC1, TSC2, VHL and XRCC2 (sequencing and deletion/duplication); EGFR, EGLN1, HOXB13, KIT, MITF, PDGFRA, POLD1, and POLE (sequencing only); EPCAM and GREM1 (deletion/duplication only). DNA and RNA analyses performed for * genes.      REVIEW OF SYSTEMS:   Constitutional: Denies fevers, chills or abnormal weight loss Eyes: Denies blurriness of vision Ears, nose, mouth, throat, and face: Denies mucositis or sore throat Gastrointestinal:  Denies nausea, heartburn or change in bowel habits Skin: Denies abnormal skin rashes Lymphatics: Denies new lymphadenopathy or easy bruising Neurological:Denies numbness, tingling or new weaknesses Behavioral/Psych: Mood is stable, no new changes  Extremities: No lower extremity edema All other systems were reviewed with the patient and are negative.  I have reviewed the past medical history, past surgical history, social history and family history with the patient and they are unchanged from previous note.  ALLERGIES:  is allergic to heparin, beef-derived products, and pork-derived products.  MEDICATIONS:  Current Outpatient Medications  Medication Sig Dispense Refill   acetaminophen (TYLENOL) 500 MG tablet Take 1,000 mg by mouth every 6 (six) hours as needed for mild pain.     APIXABAN (ELIQUIS) VTE STARTER PACK (10MG AND 5MG) Take as directed on package: start with two-4m tablets twice daily for 7 days. On day 8, switch to one-545mtablet twice daily. 1 each 0   dexamethasone (DECADRON) 4 MG tablet Take 2 tabs at the night before chemotherapy, every 3 weeks, by mouth (Patient taking differently: Take 8 mg by mouth See admin instructions. 8 mg the night before chemotherapy - every 3 weeks) 20 tablet 6   EPINEPHrine 0.3 mg/0.3 mL IJ SOAJ injection Inject into the muscle.     famotidine (PEPCID) 20 MG tablet Take 1 tablet (20 mg total) by mouth at bedtime. (Patient taking differently: Take 20 mg by mouth daily as needed for heartburn.)      gabapentin (NEURONTIN) 100 MG capsule Take 2 capsules (200 mg total) by mouth 2 (two) times daily. 120 capsule 11   lidocaine-prilocaine (EMLA) cream Apply 1 application. topically See admin instructions. Apply as needed for port access     ondansetron (ZOFRAN) 8 MG tablet Take 1 tablet (8 mg total) by mouth 2 (two) times daily as needed for refractory nausea / vomiting. Start on day 3 after carboplatin chemo. 30 tablet 1   oxyCODONE (OXY IR/ROXICODONE) 5 MG immediate release tablet Take 1 tablet by mouth every 4 hours as needed for severe pain. 30 tablet 0   pantoprazole (PROTONIX) 40 MG tablet Take 1 tablet by mouth daily. 30 tablet 3   polyethylene glycol powder (GLYCOLAX/MIRALAX) 17 GM/SCOOP powder Take 17 g by mouth daily as needed for mild constipation.     prochlorperazine (COMPAZINE) 10 MG tablet Take 1 tablet (10 mg total) by mouth every 6 (six) hours as needed (Nausea or vomiting). (Patient taking differently: Take 10 mg by mouth every 6 (six) hours as needed for nausea or vomiting.) 30 tablet 1   traZODone (DESYREL) 50 MG tablet Take 25 mg by mouth at bedtime as needed for sleep.     valACYclovir (VALTREX) 500 MG tablet Take 2,000 mg by mouth See admin instructions. Loading dose of 2,000 mg BID for 1 day PRN cold sore.     No current facility-administered medications for this visit.    PHYSICAL EXAMINATION: ECOG PERFORMANCE STATUS: 1 - Symptomatic but completely ambulatory  LABORATORY DATA:  I have reviewed the data as listed    Latest Ref Rng & Units 01/07/2022  4:08 AM 12/31/2021   10:08 AM 12/10/2021    8:26 AM  CMP  Glucose 70 - 99 mg/dL 123   179   173    BUN 6 - 20 mg/dL 18   14   15     Creatinine 0.44 - 1.00 mg/dL 0.70   0.85   0.76    Sodium 135 - 145 mmol/L 137   140   139    Potassium 3.5 - 5.1 mmol/L 4.0   4.1   4.0    Chloride 98 - 111 mmol/L 106   107   109    CO2 22 - 32 mmol/L 24   26   23     Calcium 8.9 - 10.3 mg/dL 8.9   9.4   9.2    Total Protein 6.5 -  8.1 g/dL 6.8   6.9   6.9    Total Bilirubin 0.3 - 1.2 mg/dL 0.5   0.4   0.3    Alkaline Phos 38 - 126 U/L 73   73   75    AST 15 - 41 U/L 26   17   17     ALT 0 - 44 U/L 56   29   37      Lab Results  Component Value Date   WBC 5.2 01/07/2022   HGB 10.5 (L) 01/07/2022   HCT 30.8 (L) 01/07/2022   MCV 89.5 01/07/2022   PLT 204 01/07/2022   NEUTROABS 3.8 01/07/2022     RADIOGRAPHIC STUDIES: I have personally reviewed the radiological images as listed and agreed with the findings in the report. CT Angio Chest PE W and/or Wo Contrast  Result Date: 01/07/2022 CLINICAL DATA:  Ovarian cancer with shortness of breath since yesterday and positive D-dimer. EXAM: CT ANGIOGRAPHY CHEST WITH CONTRAST TECHNIQUE: Multidetector CT imaging of the chest was performed using the standard protocol during bolus administration of intravenous contrast. Multiplanar CT image reconstructions and MIPs were obtained to evaluate the vascular anatomy. RADIATION DOSE REDUCTION: This exam was performed according to the departmental dose-optimization program which includes automated exposure control, adjustment of the mA and/or kV according to patient size and/or use of iterative reconstruction technique. CONTRAST:  20m OMNIPAQUE IOHEXOL 350 MG/ML SOLN COMPARISON:  None Available. FINDINGS: Cardiovascular: The heart size is normal. No substantial pericardial effusion. No thoracic aortic aneurysm. Right Port-A-Cath tip is positioned in the distal SVC. Acute pulmonary embolus identified in the right middle lobe pulmonary artery and segmental branches to the right lower lobe. Nonocclusive thrombus identified in proximal segmental branches to the left lower lobe. RV/LV ratio is 0.78. Mediastinum/Nodes: No mediastinal lymphadenopathy. There is no hilar lymphadenopathy. The esophagus has normal imaging features. There is no axillary lymphadenopathy. Lungs/Pleura: Atelectasis noted in the lung bases. Component of pulmonary infarct in  the right lower lobe not excluded. 8 mm perifissural nodule noted left lower lobe on image 71/7. 4 mm right upper lobe nodule identified on 35/7. No substantial pleural effusion. Upper Abdomen: Pneumobilia has been incompletely visualized in this patient with a history choledocho jejunostomy. Hypoattenuating lesions in the liver measure up to 2.5 cm in the left hepatic lobe, potentially cysts. Musculoskeletal: No worrisome lytic or sclerotic osseous abnormality. Review of the MIP images confirms the above findings. IMPRESSION: 1. Acute pulmonary embolus in the right middle lobe pulmonary artery and segmental branches to the right and left lower lobes. No evidence for right heart strain. RV/LV ratio is 0.78. 2. Atelectasis in the lung bases. Component of pulmonary  infarct in the right lower lobe not excluded. 3. Small bilateral pulmonary nodules measuring up to 8 mm. Critical Value/emergent results were called by telephone at the time of interpretation on 01/07/2022 at 7:54 am to provider [facility] , who verbally acknowledged these results. Electronically Signed   By: Misty Stanley M.D.   On: 01/07/2022 07:56   DG Chest Port 1 View  Result Date: 01/07/2022 CLINICAL DATA:  Chest pain.  Positive D-dimer. EXAM: PORTABLE CHEST 1 VIEW COMPARISON:  11/07/2021 radiographs FINDINGS: A RIGHT Port-A-Cath is again noted with tip overlying the SUPERIOR cavoatrial junction. The cardiomediastinal silhouette is unremarkable. Mild bibasilar hazy opacities are noted which may represent atelectasis versus airspace disease. There are probable trace bilateral pleural effusions present. There is no evidence of pneumothorax. No acute bony abnormalities are identified. IMPRESSION: Mild bibasilar hazy opacities which may represent atelectasis versus airspace disease. Probable trace bilateral pleural effusions. Electronically Signed   By: Margarette Canada M.D.   On: 01/07/2022 07:16   ECHOCARDIOGRAM COMPLETE  Result Date: 01/07/2022     ECHOCARDIOGRAM REPORT   Patient Name:   GISSELLA NIBLACK Date of Exam: 01/07/2022 Medical Rec #:  [id number]       Height:       63.0 in Accession #:    [id number]      Weight:       153.1 lb Date of Birth:  Apr 21, 1966       BSA:          1.726 m Patient Age:    56 years        BP:           124/86 mmHg Patient Gender: F               HR:           92 bpm. Exam Location:  Inpatient Procedure: 2D Echo, Cardiac Doppler and Color Doppler Indications:    Pulmonary embolus  History:        Patient has no prior history of Echocardiogram examinations.  Sonographer:    Jefferey Pica Referring Phys: [id number] [place]  1. Left ventricular ejection fraction, by estimation, is 60 to 65%. The left ventricle has normal function. The left ventricle has no regional wall motion abnormalities. Left ventricular diastolic parameters were normal.  2. Right ventricular systolic function is normal. The right ventricular size is normal. There is normal pulmonary artery systolic pressure. The estimated right ventricular systolic pressure is 32.4 mmHg.  3. The mitral valve is normal in structure. No evidence of mitral valve regurgitation. No evidence of mitral stenosis.  4. The aortic valve is tricuspid. Aortic valve regurgitation is not visualized.  5. The inferior vena cava is normal in size with greater than 50% respiratory variability, suggesting right atrial pressure of 3 mmHg. Comparison(s): No prior Echocardiogram. FINDINGS  Left Ventricle: Left ventricular ejection fraction, by estimation, is 60 to 65%. The left ventricle has normal function. The left ventricle has no regional wall motion abnormalities. The left ventricular internal cavity size was normal in size. There is  no left ventricular hypertrophy. Left ventricular diastolic parameters were normal. Right Ventricle: The right ventricular size is normal. No increase in right ventricular wall thickness. Right ventricular systolic function is normal. There is  normal pulmonary artery systolic pressure. The tricuspid regurgitant velocity is 2.64 m/s, and  with an assumed right atrial pressure of 3 mmHg, the estimated right ventricular systolic pressure is 40.1 mmHg. Left Atrium: Left atrial  size was normal in size. Right Atrium: Right atrial size was normal in size. Pericardium: There is no evidence of pericardial effusion. Mitral Valve: The mitral valve is normal in structure. No evidence of mitral valve regurgitation. No evidence of mitral valve stenosis. Tricuspid Valve: The tricuspid valve is normal in structure. Tricuspid valve regurgitation is mild . No evidence of tricuspid stenosis. Aortic Valve: The aortic valve is tricuspid. Aortic valve regurgitation is not visualized. Aortic valve peak gradient measures 8.2 mmHg. Pulmonic Valve: The pulmonic valve was not well visualized. Pulmonic valve regurgitation is not visualized. No evidence of pulmonic stenosis. Aorta: The aortic root and ascending aorta are structurally normal, with no evidence of dilitation. Venous: The inferior vena cava is normal in size with greater than 50% respiratory variability, suggesting right atrial pressure of 3 mmHg. IAS/Shunts: No atrial level shunt detected by color flow Doppler.  LEFT VENTRICLE PLAX 2D LVIDd:         4.30 cm   Diastology LVIDs:         3.00 cm   LV e' medial:    7.88 cm/s LV PW:         1.00 cm   LV E/e' medial:  8.8 LV IVS:        1.00 cm   LV e' lateral:   13.20 cm/s LVOT diam:     2.00 cm   LV E/e' lateral: 5.2 LV SV:         65 LV SV Index:   38 LVOT Area:     3.14 cm  RIGHT VENTRICLE             IVC RV Basal diam:  3.10 cm     IVC diam: 2.00 cm RV Mid diam:    2.80 cm RV S prime:     15.40 cm/s TAPSE (M-mode): 2.5 cm LEFT ATRIUM             Index        RIGHT ATRIUM           Index LA diam:        2.60 cm 1.51 cm/m   RA Area:     15.40 cm LA Vol (A2C):   42.2 ml 24.45 ml/m  RA Volume:   39.60 ml  22.94 ml/m LA Vol (A4C):   41.5 ml 24.04 ml/m LA Biplane Vol:  44.0 ml 25.49 ml/m  AORTIC VALVE                 PULMONIC VALVE AV Area (Vmax): 2.50 cm     PV Vmax:       0.70 m/s AV Vmax:        143.00 cm/s  PV Peak grad:  2.0 mmHg AV Peak Grad:   8.2 mmHg LVOT Vmax:      114.00 cm/s LVOT Vmean:     73.100 cm/s LVOT VTI:       0.208 m  AORTA Ao Root diam: 3.10 cm Ao Asc diam:  2.90 cm MITRAL VALVE               TRICUSPID VALVE MV Area (PHT): 3.85 cm    TR Peak grad:   27.9 mmHg MV Decel Time: 197 msec    TR Vmax:        264.00 cm/s MV E velocity: 69.00 cm/s MV A velocity: 49.70 cm/s  SHUNTS MV E/A ratio:  1.39        Systemic VTI:  0.21 m  Systemic Diam: 2.00 cm Rudean Haskell MD Electronically signed by Rudean Haskell MD Signature Date/Time: 01/07/2022/2:02:22 PM    Final     I discussed the assessment and treatment plan with the patient. The patient was provided an opportunity to ask questions and all were answered. The patient agreed with the plan and demonstrated an understanding of the instructions. The patient was advised to call back or seek an in-person evaluation if the symptoms worsen or if the condition fails to improve as anticipated.    I spent 20 minutes for the appointment reviewing test results, discuss management and coordination of care.  Heath Lark, MD 01/16/2022 10:13 AM

## 2022-01-17 ENCOUNTER — Encounter: Payer: Self-pay | Admitting: Hematology and Oncology

## 2022-01-17 ENCOUNTER — Other Ambulatory Visit: Payer: Self-pay | Admitting: Hematology and Oncology

## 2022-01-20 ENCOUNTER — Encounter: Payer: Self-pay | Admitting: Hematology and Oncology

## 2022-01-20 MED FILL — Dexamethasone Sodium Phosphate Inj 100 MG/10ML: INTRAMUSCULAR | Qty: 1 | Status: AC

## 2022-01-20 MED FILL — Fosaprepitant Dimeglumine For IV Infusion 150 MG (Base Eq): INTRAVENOUS | Qty: 5 | Status: AC

## 2022-01-21 ENCOUNTER — Other Ambulatory Visit (HOSPITAL_COMMUNITY): Payer: Self-pay

## 2022-01-21 ENCOUNTER — Other Ambulatory Visit: Payer: Self-pay

## 2022-01-21 ENCOUNTER — Inpatient Hospital Stay (HOSPITAL_BASED_OUTPATIENT_CLINIC_OR_DEPARTMENT_OTHER): Payer: 59 | Admitting: Hematology and Oncology

## 2022-01-21 ENCOUNTER — Inpatient Hospital Stay: Payer: 59

## 2022-01-21 ENCOUNTER — Encounter: Payer: Self-pay | Admitting: Hematology and Oncology

## 2022-01-21 VITALS — BP 112/85 | HR 109 | Resp 18 | Ht 63.0 in | Wt 153.2 lb

## 2022-01-21 DIAGNOSIS — Z79899 Other long term (current) drug therapy: Secondary | ICD-10-CM | POA: Diagnosis not present

## 2022-01-21 DIAGNOSIS — Z7901 Long term (current) use of anticoagulants: Secondary | ICD-10-CM | POA: Diagnosis not present

## 2022-01-21 DIAGNOSIS — C562 Malignant neoplasm of left ovary: Secondary | ICD-10-CM | POA: Diagnosis present

## 2022-01-21 DIAGNOSIS — I2699 Other pulmonary embolism without acute cor pulmonale: Secondary | ICD-10-CM | POA: Diagnosis not present

## 2022-01-21 DIAGNOSIS — Z9221 Personal history of antineoplastic chemotherapy: Secondary | ICD-10-CM | POA: Diagnosis not present

## 2022-01-21 DIAGNOSIS — Z7952 Long term (current) use of systemic steroids: Secondary | ICD-10-CM | POA: Diagnosis not present

## 2022-01-21 DIAGNOSIS — Z86711 Personal history of pulmonary embolism: Secondary | ICD-10-CM | POA: Diagnosis not present

## 2022-01-21 DIAGNOSIS — Z7189 Other specified counseling: Secondary | ICD-10-CM | POA: Insufficient documentation

## 2022-01-21 LAB — CBC WITH DIFFERENTIAL (CANCER CENTER ONLY)
Abs Immature Granulocytes: 0.01 10*3/uL (ref 0.00–0.07)
Basophils Absolute: 0 10*3/uL (ref 0.0–0.1)
Basophils Relative: 1 %
Eosinophils Absolute: 0 10*3/uL (ref 0.0–0.5)
Eosinophils Relative: 0 %
HCT: 30.5 % — ABNORMAL LOW (ref 36.0–46.0)
Hemoglobin: 10.2 g/dL — ABNORMAL LOW (ref 12.0–15.0)
Immature Granulocytes: 0 %
Lymphocytes Relative: 16 %
Lymphs Abs: 0.7 10*3/uL (ref 0.7–4.0)
MCH: 30.4 pg (ref 26.0–34.0)
MCHC: 33.4 g/dL (ref 30.0–36.0)
MCV: 91 fL (ref 80.0–100.0)
Monocytes Absolute: 0.1 10*3/uL (ref 0.1–1.0)
Monocytes Relative: 3 %
Neutro Abs: 3.4 10*3/uL (ref 1.7–7.7)
Neutrophils Relative %: 80 %
Platelet Count: 229 10*3/uL (ref 150–400)
RBC: 3.35 MIL/uL — ABNORMAL LOW (ref 3.87–5.11)
RDW: 17.8 % — ABNORMAL HIGH (ref 11.5–15.5)
WBC Count: 4.3 10*3/uL (ref 4.0–10.5)
nRBC: 0 % (ref 0.0–0.2)

## 2022-01-21 LAB — CMP (CANCER CENTER ONLY)
ALT: 18 U/L (ref 0–44)
AST: 14 U/L — ABNORMAL LOW (ref 15–41)
Albumin: 4.5 g/dL (ref 3.5–5.0)
Alkaline Phosphatase: 81 U/L (ref 38–126)
Anion gap: 7 (ref 5–15)
BUN: 13 mg/dL (ref 6–20)
CO2: 25 mmol/L (ref 22–32)
Calcium: 9.7 mg/dL (ref 8.9–10.3)
Chloride: 107 mmol/L (ref 98–111)
Creatinine: 0.84 mg/dL (ref 0.44–1.00)
GFR, Estimated: >60 mL/min (ref >60)
Glucose, Bld: 176 mg/dL — ABNORMAL HIGH (ref 70–99)
Potassium: 3.8 mmol/L (ref 3.5–5.1)
Sodium: 139 mmol/L (ref 135–145)
Total Bilirubin: 0.4 mg/dL (ref 0.3–1.2)
Total Protein: 7.2 g/dL (ref 6.5–8.1)

## 2022-01-21 MED ORDER — APIXABAN 5 MG PO TABS
5.0000 mg | ORAL_TABLET | Freq: Two times a day (BID) | ORAL | 11 refills | Status: DC
  Filled 2022-01-21: qty 60, 30d supply, fill #0

## 2022-01-21 MED ORDER — SODIUM CHLORIDE 0.9% FLUSH
10.0000 mL | Freq: Once | INTRAVENOUS | Status: AC
  Administered 2022-01-21: 10 mL

## 2022-01-21 NOTE — Assessment & Plan Note (Signed)
The patient has significant decline in performance status due to recent PE She tolerated recent chemotherapy poorly with major side effects We have extensive discussions about the role of continuing further treatment with further dose reduction, omission of paclitaxel meaning she will proceed with single agent carboplatin only versus stopping treatment After long discussion, we have made informed decision to discontinue chemotherapy I plan to see her back next month for further follow-up with repeat blood work and imaging study If CT imaging confirm complete response to treatment, I will get her port removed

## 2022-01-21 NOTE — Progress Notes (Signed)
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[place]) The patient has significant decline in performance status due to recent PE She tolerated recent chemotherapy poorly with major side effects We have extensive discussions about the role of continuing further treatment with further dose reduction, omission of paclitaxel meaning she will proceed with single agent carboplatin only versus stopping treatment After long discussion, we have made informed decision to discontinue chemotherapy I plan to see her back next month for further follow-up with repeat blood work and imaging study If CT imaging confirm complete response to treatment, I will get her port removed  Bilateral pulmonary embolism (HCC) Her pleuritic chest pain has resolved She had easy bruising but no bleeding complications from anticoagulation therapy Her stamina is profoundly reduced due to this I recommend graduated exercise as tolerated I recommend minimum 6 months of anticoagulation therapy  Goals of care, counseling/discussion We had extensive discussions about goals of care We have made informed decision to discontinue treatment after 4 cycles of chemotherapy  Orders Placed This Encounter  Procedures   CT CHEST ABDOMEN PELVIS W CONTRAST    Standing Status:   Future    Standing Expiration Date:   01/22/2023    Order Specific Question:   Preferred imaging location?    Answer:   [facility]    Order Specific Question:   Radiology Contrast Protocol - do NOT remove file path    Answer:   [email or web address]    Order Specific Question:   Is patient pregnant?    Answer:   No   CBC with Differential/Platelet    Standing Status:   Standing    Number of Occurrences:   22    Standing Expiration Date:   01/22/2023   Comprehensive metabolic panel    Standing Status:   Standing     Number of Occurrences:   33    Standing Expiration Date:   01/22/2023   CA 125    Standing Status:   Standing    Number of Occurrences:   11    Standing Expiration Date:   01/22/2023    All questions were answered. The patient knows to call the clinic with any problems, questions or concerns. The total time spent in the appointment was 40 minutes encounter with patients including review of chart and various tests results, discussions about plan of care and coordination of care plan   Heath Lark, MD 01/21/2022 11:16 AM  INTERVAL HISTORY: Please see below for problem oriented charting. she returns for treatment follow-up seen prior to cycle 5 of chemotherapy Her pleuritic chest pain has resolved over the weekend but she continues to have poor stamina She has shortness of breath and tachycardia with minimal exertion No dizziness or syncopal episode She is fearful about potential uncontrollable complications or more side effects from future treatment  REVIEW OF SYSTEMS:    All other systems were reviewed with the patient and are negative.  I have reviewed the past medical history, past surgical history, social history and family history with the patient and they are unchanged from previous note.  ALLERGIES:  is allergic to heparin, beef-derived products, and pork-derived products.  MEDICATIONS:  Current Outpatient Medications  Medication Sig Dispense Refill   apixaban (ELIQUIS) 5 MG TABS tablet Take 1 tablet (5 mg total) by mouth 2 (two) times daily. 60 tablet 11   acetaminophen (TYLENOL) 500 MG  tablet Take 1,000 mg by mouth every 6 (six) hours as needed for mild pain.     dexamethasone (DECADRON) 4 MG tablet Take 2 tabs at the night before chemotherapy, every 3 weeks, by mouth (Patient taking differently: Take 8 mg by mouth See admin instructions. 8 mg the night before chemotherapy - every 3 weeks) 20 tablet 6   EPINEPHrine 0.3 mg/0.3 mL IJ SOAJ injection Inject into the muscle.      famotidine (PEPCID) 20 MG tablet Take 1 tablet (20 mg total) by mouth at bedtime. (Patient taking differently: Take 20 mg by mouth daily as needed for heartburn.)     gabapentin (NEURONTIN) 100 MG capsule Take 2 capsules (200 mg total) by mouth 2 (two) times daily. 120 capsule 11   lidocaine-prilocaine (EMLA) cream Apply 1 application. topically See admin instructions. Apply as needed for port access     oxyCODONE (OXY IR/ROXICODONE) 5 MG immediate release tablet Take 1 tablet by mouth every 4 hours as needed for severe pain. 30 tablet 0   pantoprazole (PROTONIX) 40 MG tablet Take 1 tablet by mouth daily. 30 tablet 3   polyethylene glycol powder (GLYCOLAX/MIRALAX) 17 GM/SCOOP powder Take 17 g by mouth daily as needed for mild constipation.     traZODone (DESYREL) 50 MG tablet Take 25 mg by mouth at bedtime as needed for sleep.     valACYclovir (VALTREX) 500 MG tablet Take 2,000 mg by mouth See admin instructions. Loading dose of 2,000 mg BID for 1 day PRN cold sore.     No current facility-administered medications for this visit.    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 She has never undergone biopsy of the retroperitoneal tissue and its been historically activated to her multiple abdominal surgeries. MRI scan on 06/2020 showed fatty infiltration of the retroperitoneal space surrounding the aorta and IVC to the bifurcation with subcentimeter retroperitoneal nodes that appeared stable to marginally improved from prior CT, consistent with retroperitoneal fibrosis. At her most recent visit in November, she reported that she was overall doing well, continues to have chronic intermittent right upper quadrant abdominal pain. She had a repeat CT scan in November 2022 which showed an  abnormal appearance of the left ovary with somewhat masslike appearance and ill-defined margins. She then had a follow-up ultrasound in November 2022 that showed a moderately lobulated mass within the left adnexa that was solid with mild peripheral and central vascularity, measuring about 4 cm, O-RADS 4, no findings of ascites. She has a history of endometriosis (s/p LSC ovarian cystectomies for several endometriomas and is s/p [facility] hysterectomy).   07/09/2021 Imaging   Ct abdomen and pelvis Abnormal appearance of the left ovary with somewhat mass like appearance and ill defined margins, possibly neoplasm.  Recommend pelvic ultrasound for further evaluation.   Infiltrative changes of the retroperitoneum, similar compared with previous examination, consistent with known retroperitoneal fibrosis     09/19/2021 Pathology Results   A. Ovary and fallopian tube, left, salpingo-oophorectomy - Endometrioid adenocarcinoma, seromucinous type, FIGO grade 1, present as multiple foci of invasive carcinoma arising in a background of serousmucinous borderline tumor, see comment and synoptic report -Ovarian surface involvement is identified -Left fallopian tube with focal endometriosis and focal foreign body giant cell reaction consistent with prior surgical intervention, no involvement by tumor identified  B.  Ovary and Fallopian tube, right, salpingo-oophorectomy -Serous cystadenoma, 1.7 cm, right ovary  -Background ovary with focal endometriosis and surface adhesions -Right fallopian tube with serosal adhesions -No involvement by tumor is identified   C.  Omental biopsy -Benign adipose tissue, no involvement by tumor identified   09/19/2021 Surgery   Procedures: Robotic assisted bilateral salpingo-oophorectomy (CPT 5343953989), omental biopsy, bilateral ureterolysis (CPT 99242), lysis of adhesions  Surgeon: Cindie Laroche, MD  Assistants: Bernadene Bell, MD - Fellow, Deeann Cree, MD - Resident Findings: On  entry to abdomen, dense adhesions of the omentum to the anterior abdominal wall, unable to visualize upper abdomen, specifically unable to visualize the diaphragm, liver, stomach. Omentum grossly normal apart from adhesions. Entry site inspected with no trauma to surrounding structures. Bilateral complex ovarian masses, left approximately 4cm and right 3cm. Bilateral ovarian masses densely adherent to the pelvic sidewalls, and the right mass adherent to the mesentery of the rectum. Uterus and cervix surgically absent. Retroperitoneal fibrosis bilaterally requiring ureterolysis from the pelvic brim to the insertion into the bladder bilaterally. IOFS c/w borderline tumor.   Specimens:  ID Type Source Tests Collected by Time Destination  1 : left tube and ovary Tissue Ovary, Left SURGICAL PATHOLOGY EXAM Bernadene Bell, MD 09/19/2021 [phone]  2 : pelvic washing Washing Pelvic cavity CYTOLOGY - EXAM FROM OPTIME Christella Hartigan, MD 09/19/2021 [phone]  3 : right ovary and tube Tissue Ovary, Right SURGICAL PATHOLOGY EXAM Christella Hartigan, MD 09/19/2021 1012  4 : omental biopsy Tissue Omentum SURGICAL PATHOLOGY EXAM Christella Hartigan, MD 09/19/2021 1012    10/29/2021 Procedure   Successful placement of a single lumen Power port catheter in the right internal jugular vein under ultrasound and fluoroscopy at [facility]   10/30/2021 -  Chemotherapy   She received first cycle of carboplatin and taxol at [facility]   11/04/2021 Initial Diagnosis   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 Right-sided venous Port-A-Cath positioning, as described above. 2. No acute or active cardiopulmonary disease.   11/19/2021 - 12/31/2021  Chemotherapy   Patient is on Treatment Plan : OVARIAN Carboplatin (AUC 6) / Paclitaxel (175) q21d x 4 cycles (including 1 cycle from [facility])     01/02/2022 Genetic Testing   Negative genetic testing on the cancerNext-Expanded+RNAinsight panel.  The report date is Jan 02, 2022.  The CancerNext-Expanded gene panel offered by [facility] and includes sequencing and rearrangement analysis for the following 77 genes: AIP, ALK, APC*, ATM*, AXIN2, BAP1, BARD1, BLM, BMPR1A, BRCA1*, BRCA2*, BRIP1*, CDC73, CDH1*, CDK4, CDKN1B, CDKN2A, CHEK2*, CTNNA1, DICER1, FANCC, FH, FLCN, GALNT12, KIF1B, LZTR1, MAX, MEN1, MET, MLH1*, MSH2*, MSH3, MSH6*, MUTYH*, NBN, NF1*, NF2, NTHL1, PALB2*, PHOX2B, PMS2*, POT1, PRKAR1A, PTCH1, PTEN*, RAD51C*, RAD51D*, RB1, RECQL, RET, SDHA, SDHAF2, SDHB, SDHC, SDHD, SMAD4, SMARCA4, SMARCB1, SMARCE1, STK11, SUFU, TMEM127, TP53*, TSC1, TSC2, VHL and XRCC2 (sequencing and deletion/duplication); EGFR, EGLN1, HOXB13, KIT, MITF, PDGFRA, POLD1, and POLE (sequencing only); EPCAM and GREM1 (deletion/duplication only). DNA and RNA analyses performed for * genes.    01/07/2022 Imaging   1. Acute pulmonary embolus in the right middle lobe pulmonary artery and segmental branches to the right and left lower lobes. No evidence for right heart  strain. RV/LV ratio is 0.78. 2. Atelectasis in the lung bases. Component of pulmonary infarct in the right lower lobe not excluded. 3. Small bilateral pulmonary nodules measuring up to 8 mm.     PHYSICAL EXAMINATION: ECOG PERFORMANCE STATUS: 2 - Symptomatic, <50% confined to bed  Vitals:   01/21/22 0933  BP: 112/85  Pulse: (!) 109  Resp: 18  SpO2: 97%   Filed Weights   01/21/22 0933  Weight: 153 lb 3.2 oz (69.5 kg)    GENERAL:alert, no distress and comfortable NEURO: alert & oriented x 3 with fluent speech, no focal motor/sensory deficits  LABORATORY DATA:  I have reviewed the data as listed    Component Value Date/Time   NA 139 01/21/2022 0923    K 3.8 01/21/2022 0923   CL 107 01/21/2022 0923   CO2 25 01/21/2022 0923   GLUCOSE 176 (H) 01/21/2022 0923   BUN 13 01/21/2022 0923   CREATININE 0.84 01/21/2022 0923   CALCIUM 9.7 01/21/2022 0923   PROT 7.2 01/21/2022 0923   ALBUMIN 4.5 01/21/2022 0923   AST 14 (L) 01/21/2022 0923   ALT 18 01/21/2022 0923   ALKPHOS 81 01/21/2022 0923   BILITOT 0.4 01/21/2022 0923   GFRNONAA >60 01/21/2022 0923    No results found for: SPEP, UPEP  Lab Results  Component Value Date   WBC 4.3 01/21/2022   NEUTROABS 3.4 01/21/2022   HGB 10.2 (L) 01/21/2022   HCT 30.5 (L) 01/21/2022   MCV 91.0 01/21/2022   PLT 229 01/21/2022      Chemistry      Component Value Date/Time   NA 139 01/21/2022 0923   K 3.8 01/21/2022 0923   CL 107 01/21/2022 0923   CO2 25 01/21/2022 0923   BUN 13 01/21/2022 0923   CREATININE 0.84 01/21/2022 0923      Component Value Date/Time   CALCIUM 9.7 01/21/2022 0923   ALKPHOS 81 01/21/2022 0923   AST 14 (L) 01/21/2022 0923   ALT 18 01/21/2022 0923   BILITOT 0.4 01/21/2022 0923       RADIOGRAPHIC STUDIES: I have personally reviewed the radiological images as listed and agreed with the findings in the report. CT Angio Chest PE W and/or Wo Contrast  Result Date: 01/07/2022 CLINICAL DATA:  Ovarian cancer with shortness of breath since yesterday and positive D-dimer. EXAM: CT ANGIOGRAPHY CHEST WITH CONTRAST TECHNIQUE: Multidetector CT imaging of the chest was performed using the standard protocol during bolus administration of intravenous contrast. Multiplanar CT image reconstructions and MIPs were obtained to evaluate the vascular anatomy. RADIATION DOSE REDUCTION: This exam was performed according to the departmental dose-optimization program which includes automated exposure control, adjustment of the mA and/or kV according to patient size and/or use of iterative reconstruction technique. CONTRAST:  24m OMNIPAQUE IOHEXOL 350 MG/ML SOLN COMPARISON:  None Available.  FINDINGS: Cardiovascular: The heart size is normal. No substantial pericardial effusion. No thoracic aortic aneurysm. Right Port-A-Cath tip is positioned in the distal SVC. Acute pulmonary embolus identified in the right middle lobe pulmonary artery and segmental branches to the right lower lobe. Nonocclusive thrombus identified in proximal segmental branches to the left lower lobe. RV/LV ratio is 0.78. Mediastinum/Nodes: No mediastinal lymphadenopathy. There is no hilar lymphadenopathy. The esophagus has normal imaging features. There is no axillary lymphadenopathy. Lungs/Pleura: Atelectasis noted in the lung bases. Component of pulmonary infarct in the right lower lobe not excluded. 8 mm perifissural nodule noted left lower lobe on image 71/7. 4 mm right upper lobe  nodule identified on 35/7. No substantial pleural effusion. Upper Abdomen: Pneumobilia has been incompletely visualized in this patient with a history choledocho jejunostomy. Hypoattenuating lesions in the liver measure up to 2.5 cm in the left hepatic lobe, potentially cysts. Musculoskeletal: No worrisome lytic or sclerotic osseous abnormality. Review of the MIP images confirms the above findings. IMPRESSION: 1. Acute pulmonary embolus in the right middle lobe pulmonary artery and segmental branches to the right and left lower lobes. No evidence for right heart strain. RV/LV ratio is 0.78. 2. Atelectasis in the lung bases. Component of pulmonary infarct in the right lower lobe not excluded. 3. Small bilateral pulmonary nodules measuring up to 8 mm. Critical Value/emergent results were called by telephone at the time of interpretation on 01/07/2022 at 7:54 am to provider [facility] , who verbally acknowledged these results. Electronically Signed   By: Misty Stanley M.D.   On: 01/07/2022 07:56   DG Chest Port 1 View  Result Date: 01/07/2022 CLINICAL DATA:  Chest pain.  Positive D-dimer. EXAM: PORTABLE CHEST 1 VIEW COMPARISON:  11/07/2021  radiographs FINDINGS: A RIGHT Port-A-Cath is again noted with tip overlying the SUPERIOR cavoatrial junction. The cardiomediastinal silhouette is unremarkable. Mild bibasilar hazy opacities are noted which may represent atelectasis versus airspace disease. There are probable trace bilateral pleural effusions present. There is no evidence of pneumothorax. No acute bony abnormalities are identified. IMPRESSION: Mild bibasilar hazy opacities which may represent atelectasis versus airspace disease. Probable trace bilateral pleural effusions. Electronically Signed   By: Margarette Canada M.D.   On: 01/07/2022 07:16   ECHOCARDIOGRAM COMPLETE  Result Date: 01/07/2022    ECHOCARDIOGRAM REPORT   Patient Name:   Kristine White Date of Exam: 01/07/2022 Medical Rec #:  [id number]       Height:       63.0 in Accession #:    [id number]      Weight:       153.1 lb Date of Birth:  1966-04-06       BSA:          1.726 m Patient Age:    56 years        BP:           124/86 mmHg Patient Gender: F               HR:           92 bpm. Exam Location:  Inpatient Procedure: 2D Echo, Cardiac Doppler and Color Doppler Indications:    Pulmonary embolus  History:        Patient has no prior history of Echocardiogram examinations.  Sonographer:    Jefferey Pica Referring Phys: [id number] [place]  1. Left ventricular ejection fraction, by estimation, is 60 to 65%. The left ventricle has normal function. The left ventricle has no regional wall motion abnormalities. Left ventricular diastolic parameters were normal.  2. Right ventricular systolic function is normal. The right ventricular size is normal. There is normal pulmonary artery systolic pressure. The estimated right ventricular systolic pressure is 01.7 mmHg.  3. The mitral valve is normal in structure. No evidence of mitral valve regurgitation. No evidence of mitral stenosis.  4. The aortic valve is tricuspid. Aortic valve regurgitation is not visualized.  5. The  inferior vena cava is normal in size with greater than 50% respiratory variability, suggesting right atrial pressure of 3 mmHg. Comparison(s): No prior Echocardiogram. FINDINGS  Left Ventricle: Left ventricular ejection fraction, by estimation, is 60 to  65%. The left ventricle has normal function. The left ventricle has no regional wall motion abnormalities. The left ventricular internal cavity size was normal in size. There is  no left ventricular hypertrophy. Left ventricular diastolic parameters were normal. Right Ventricle: The right ventricular size is normal. No increase in right ventricular wall thickness. Right ventricular systolic function is normal. There is normal pulmonary artery systolic pressure. The tricuspid regurgitant velocity is 2.64 m/s, and  with an assumed right atrial pressure of 3 mmHg, the estimated right ventricular systolic pressure is 65.6 mmHg. Left Atrium: Left atrial size was normal in size. Right Atrium: Right atrial size was normal in size. Pericardium: There is no evidence of pericardial effusion. Mitral Valve: The mitral valve is normal in structure. No evidence of mitral valve regurgitation. No evidence of mitral valve stenosis. Tricuspid Valve: The tricuspid valve is normal in structure. Tricuspid valve regurgitation is mild . No evidence of tricuspid stenosis. Aortic Valve: The aortic valve is tricuspid. Aortic valve regurgitation is not visualized. Aortic valve peak gradient measures 8.2 mmHg. Pulmonic Valve: The pulmonic valve was not well visualized. Pulmonic valve regurgitation is not visualized. No evidence of pulmonic stenosis. Aorta: The aortic root and ascending aorta are structurally normal, with no evidence of dilitation. Venous: The inferior vena cava is normal in size with greater than 50% respiratory variability, suggesting right atrial pressure of 3 mmHg. IAS/Shunts: No atrial level shunt detected by color flow Doppler.  LEFT VENTRICLE PLAX 2D LVIDd:         4.30  cm   Diastology LVIDs:         3.00 cm   LV e' medial:    7.88 cm/s LV PW:         1.00 cm   LV E/e' medial:  8.8 LV IVS:        1.00 cm   LV e' lateral:   13.20 cm/s LVOT diam:     2.00 cm   LV E/e' lateral: 5.2 LV SV:         65 LV SV Index:   38 LVOT Area:     3.14 cm  RIGHT VENTRICLE             IVC RV Basal diam:  3.10 cm     IVC diam: 2.00 cm RV Mid diam:    2.80 cm RV S prime:     15.40 cm/s TAPSE (M-mode): 2.5 cm LEFT ATRIUM             Index        RIGHT ATRIUM           Index LA diam:        2.60 cm 1.51 cm/m   RA Area:     15.40 cm LA Vol (A2C):   42.2 ml 24.45 ml/m  RA Volume:   39.60 ml  22.94 ml/m LA Vol (A4C):   41.5 ml 24.04 ml/m LA Biplane Vol: 44.0 ml 25.49 ml/m  AORTIC VALVE                 PULMONIC VALVE AV Area (Vmax): 2.50 cm     PV Vmax:       0.70 m/s AV Vmax:        143.00 cm/s  PV Peak grad:  2.0 mmHg AV Peak Grad:   8.2 mmHg LVOT Vmax:      114.00 cm/s LVOT Vmean:     73.100 cm/s LVOT VTI:  0.208 m  AORTA Ao Root diam: 3.10 cm Ao Asc diam:  2.90 cm MITRAL VALVE               TRICUSPID VALVE MV Area (PHT): 3.85 cm    TR Peak grad:   27.9 mmHg MV Decel Time: 197 msec    TR Vmax:        264.00 cm/s MV E velocity: 69.00 cm/s MV A velocity: 49.70 cm/s  SHUNTS MV E/A ratio:  1.39        Systemic VTI:  0.21 m                            Systemic Diam: 2.00 cm Rudean Haskell MD Electronically signed by Rudean Haskell MD Signature Date/Time: 01/07/2022/2:02:22 PM    Final

## 2022-01-21 NOTE — Assessment & Plan Note (Signed)
We had extensive discussions about goals of care We have made informed decision to discontinue treatment after 4 cycles of chemotherapy

## 2022-01-21 NOTE — Assessment & Plan Note (Signed)
Her pleuritic chest pain has resolved She had easy bruising but no bleeding complications from anticoagulation therapy Her stamina is profoundly reduced due to this I recommend graduated exercise as tolerated I recommend minimum 6 months of anticoagulation therapy

## 2022-01-22 ENCOUNTER — Telehealth: Payer: Self-pay | Admitting: *Deleted

## 2022-01-22 ENCOUNTER — Encounter: Payer: Self-pay | Admitting: Hematology and Oncology

## 2022-01-22 NOTE — Telephone Encounter (Signed)
Attempted to contact patient x 2. LVM that MD office was calling in response to MyChart message.

## 2022-01-23 ENCOUNTER — Telehealth: Payer: Self-pay | Admitting: *Deleted

## 2022-01-23 NOTE — Telephone Encounter (Signed)
Contacted patient as requested by Dr. Alvy Bimler with following message: Lots of patients have cold like symptoms,  can take Mucinex, etc helps symptoms. If she wants a course of antibiotics, can send it in, but it may not help.   Patient declined antibiotics at this time. Gave her info on mucinex, helps to thin secretions. She will let office know if symptoms worsen.   MD informed of patient response

## 2022-01-27 ENCOUNTER — Other Ambulatory Visit (HOSPITAL_COMMUNITY): Payer: Self-pay

## 2022-01-30 ENCOUNTER — Other Ambulatory Visit (HOSPITAL_COMMUNITY): Payer: Self-pay

## 2022-02-04 ENCOUNTER — Encounter: Payer: Self-pay | Admitting: Hematology and Oncology

## 2022-02-04 ENCOUNTER — Other Ambulatory Visit: Payer: Self-pay

## 2022-02-04 MED ORDER — APIXABAN 5 MG PO TABS: 5.0000 mg | ORAL_TABLET | Freq: Two times a day (BID) | ORAL | 11 refills | Status: DC

## 2022-02-19 ENCOUNTER — Encounter: Payer: Self-pay | Admitting: Hematology and Oncology

## 2022-02-20 ENCOUNTER — Inpatient Hospital Stay: Payer: 59 | Attending: Hematology and Oncology

## 2022-02-20 ENCOUNTER — Ambulatory Visit (HOSPITAL_COMMUNITY)
Admission: RE | Admit: 2022-02-20 | Discharge: 2022-02-20 | Disposition: A | Discharge status: Home / Self Care | Payer: 59 | Source: Ambulatory Visit | Attending: Hematology and Oncology | Admitting: Hematology and Oncology

## 2022-02-20 ENCOUNTER — Other Ambulatory Visit: Payer: Self-pay

## 2022-02-20 DIAGNOSIS — Z9049 Acquired absence of other specified parts of digestive tract: Secondary | ICD-10-CM | POA: Insufficient documentation

## 2022-02-20 DIAGNOSIS — C562 Malignant neoplasm of left ovary: Secondary | ICD-10-CM | POA: Insufficient documentation

## 2022-02-20 DIAGNOSIS — G620 Drug-induced polyneuropathy: Secondary | ICD-10-CM | POA: Diagnosis not present

## 2022-02-20 DIAGNOSIS — Z79899 Other long term (current) drug therapy: Secondary | ICD-10-CM | POA: Diagnosis not present

## 2022-02-20 DIAGNOSIS — I2699 Other pulmonary embolism without acute cor pulmonale: Secondary | ICD-10-CM | POA: Insufficient documentation

## 2022-02-20 DIAGNOSIS — T451X5A Adverse effect of antineoplastic and immunosuppressive drugs, initial encounter: Secondary | ICD-10-CM | POA: Diagnosis not present

## 2022-02-20 DIAGNOSIS — K2970 Gastritis, unspecified, without bleeding: Secondary | ICD-10-CM | POA: Diagnosis not present

## 2022-02-20 DIAGNOSIS — Z86711 Personal history of pulmonary embolism: Secondary | ICD-10-CM | POA: Insufficient documentation

## 2022-02-20 DIAGNOSIS — Z8543 Personal history of malignant neoplasm of ovary: Secondary | ICD-10-CM | POA: Insufficient documentation

## 2022-02-20 DIAGNOSIS — R918 Other nonspecific abnormal finding of lung field: Secondary | ICD-10-CM | POA: Diagnosis not present

## 2022-02-20 DIAGNOSIS — Z7901 Long term (current) use of anticoagulants: Secondary | ICD-10-CM | POA: Insufficient documentation

## 2022-02-20 DIAGNOSIS — R232 Flushing: Secondary | ICD-10-CM | POA: Diagnosis not present

## 2022-02-20 LAB — CBC WITH DIFFERENTIAL/PLATELET
Abs Immature Granulocytes: 0.03 10*3/uL (ref 0.00–0.07)
Basophils Absolute: 0.1 10*3/uL (ref 0.0–0.1)
Basophils Relative: 1 %
Eosinophils Absolute: 0.2 10*3/uL (ref 0.0–0.5)
Eosinophils Relative: 3 %
HCT: 35.5 % — ABNORMAL LOW (ref 36.0–46.0)
Hemoglobin: 11.8 g/dL — ABNORMAL LOW (ref 12.0–15.0)
Immature Granulocytes: 0 %
Lymphocytes Relative: 24 %
Lymphs Abs: 1.7 10*3/uL (ref 0.7–4.0)
MCH: 30.6 pg (ref 26.0–34.0)
MCHC: 33.2 g/dL (ref 30.0–36.0)
MCV: 92.2 fL (ref 80.0–100.0)
Monocytes Absolute: 0.5 10*3/uL (ref 0.1–1.0)
Monocytes Relative: 6 %
Neutro Abs: 4.8 10*3/uL (ref 1.7–7.7)
Neutrophils Relative %: 66 %
Platelets: 270 10*3/uL (ref 150–400)
RBC: 3.85 MIL/uL — ABNORMAL LOW (ref 3.87–5.11)
RDW: 14.6 % (ref 11.5–15.5)
WBC: 7.3 10*3/uL (ref 4.0–10.5)
nRBC: 0 % (ref 0.0–0.2)

## 2022-02-20 LAB — COMPREHENSIVE METABOLIC PANEL
ALT: 25 U/L (ref 0–44)
AST: 19 U/L (ref 15–41)
Albumin: 4.5 g/dL (ref 3.5–5.0)
Alkaline Phosphatase: 71 U/L (ref 38–126)
Anion gap: 7 (ref 5–15)
BUN: 15 mg/dL (ref 6–20)
CO2: 26 mmol/L (ref 22–32)
Calcium: 9.4 mg/dL (ref 8.9–10.3)
Chloride: 107 mmol/L (ref 98–111)
Creatinine, Ser: 0.79 mg/dL (ref 0.44–1.00)
GFR, Estimated: >60 mL/min (ref >60)
Glucose, Bld: 96 mg/dL (ref 70–99)
Potassium: 3.9 mmol/L (ref 3.5–5.1)
Sodium: 140 mmol/L (ref 135–145)
Total Bilirubin: 0.4 mg/dL (ref 0.3–1.2)
Total Protein: 7 g/dL (ref 6.5–8.1)

## 2022-02-20 MED ORDER — IOHEXOL 300 MG/ML  SOLN
100.0000 mL | Freq: Once | INTRAMUSCULAR | Status: AC | PRN
  Administered 2022-02-20: 100 mL via INTRAVENOUS

## 2022-02-20 MED ORDER — SODIUM CHLORIDE 0.9% FLUSH
10.0000 mL | Freq: Once | INTRAVENOUS | Status: AC
  Administered 2022-02-20: 10 mL

## 2022-02-20 MED ORDER — SODIUM CHLORIDE (PF) 0.9 % IJ SOLN
INTRAMUSCULAR | Status: AC
  Filled 2022-02-20: qty 50

## 2022-02-21 ENCOUNTER — Other Ambulatory Visit: Payer: Self-pay | Admitting: Hematology and Oncology

## 2022-02-21 ENCOUNTER — Encounter: Payer: Self-pay | Admitting: Hematology and Oncology

## 2022-02-21 LAB — CA 125: Cancer Antigen (CA) 125: 23.9 U/mL (ref 0.0–38.1)

## 2022-02-25 ENCOUNTER — Encounter: Payer: Self-pay | Admitting: Hematology and Oncology

## 2022-02-25 ENCOUNTER — Other Ambulatory Visit: Payer: Self-pay

## 2022-02-25 ENCOUNTER — Inpatient Hospital Stay (HOSPITAL_BASED_OUTPATIENT_CLINIC_OR_DEPARTMENT_OTHER): Payer: 59 | Admitting: Hematology and Oncology

## 2022-02-25 DIAGNOSIS — G620 Drug-induced polyneuropathy: Secondary | ICD-10-CM | POA: Diagnosis not present

## 2022-02-25 DIAGNOSIS — I2699 Other pulmonary embolism without acute cor pulmonale: Secondary | ICD-10-CM

## 2022-02-25 DIAGNOSIS — K2900 Acute gastritis without bleeding: Secondary | ICD-10-CM | POA: Diagnosis not present

## 2022-02-25 DIAGNOSIS — T451X5A Adverse effect of antineoplastic and immunosuppressive drugs, initial encounter: Secondary | ICD-10-CM

## 2022-02-25 DIAGNOSIS — R232 Flushing: Secondary | ICD-10-CM

## 2022-02-25 DIAGNOSIS — C562 Malignant neoplasm of left ovary: Secondary | ICD-10-CM | POA: Diagnosis not present

## 2022-02-25 MED ORDER — ESTRADIOL 0.1 MG/GM VA CREA: 1.0000 | TOPICAL_CREAM | Freq: Every day | VAGINAL | 3 refills | Status: DC

## 2022-02-25 NOTE — Assessment & Plan Note (Signed)
She has minimal residual peripheral neuropathy Observe only She can continue gabapentin to take as needed

## 2022-02-25 NOTE — Assessment & Plan Note (Signed)
She can continue pantoprazole as needed

## 2022-02-25 NOTE — Progress Notes (Signed)
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 We discussed timing of port removal The patient does not desire to return back to [facility] for follow-up I will get her seen by one of our GYN surgeon here  Bilateral pulmonary embolism ([place]) She will continue treatment for minimum 6 months of anticoagulation therapy She is doing well with no signs of bleeding  Peripheral neuropathy due to chemotherapy [facility]) She has minimal residual peripheral neuropathy Observe only She can continue gabapentin to take as needed  Gastritis She can continue pantoprazole as needed  Hot flashes She has intermittent hot flashes Gabapentin seems to help She also have vaginal dryness I will prescribe topical estrogen cream  No orders of the defined types were placed in this encounter.   All questions were answered. The patient knows to call the clinic with any problems, questions or concerns. The total time spent in the appointment was 30 minutes encounter with patients including review of chart and various tests results, discussions about plan of care and coordination of care plan   Heath Lark, MD 02/25/2022 3:32 PM  INTERVAL HISTORY: Please see below for problem oriented charting. she returns for follow-up and review of test results She is doing well Her energy level has improved She has very minimal residual neuropathy No recent bleeding complications from anticoagulation therapy  REVIEW OF SYSTEMS:   Constitutional: Denies fevers, chills or abnormal weight loss Eyes: Denies blurriness of vision Ears, nose, mouth, throat, and face: Denies mucositis or sore throat Respiratory: Denies cough, dyspnea or wheezes Cardiovascular: Denies palpitation, chest discomfort or lower extremity swelling Gastrointestinal:  Denies nausea, heartburn or change in bowel  habits Skin: Denies abnormal skin rashes Lymphatics: Denies new lymphadenopathy or easy bruising Behavioral/Psych: Mood is stable, no new changes  All other systems were reviewed with the patient and are negative.  I have reviewed the past medical history, past surgical history, social history and family history with the patient and they are unchanged from previous note.  ALLERGIES:  is allergic to heparin, beef-derived products, and pork-derived products.  MEDICATIONS:  Current Outpatient Medications  Medication Sig Dispense Refill   estradiol (ESTRACE VAGINAL) 0.1 MG/GM vaginal cream Place 1 Applicatorful vaginally at bedtime. 42.5 g 3   acetaminophen (TYLENOL) 500 MG tablet Take 1,000 mg by mouth every 6 (six) hours as needed for mild pain.     apixaban (ELIQUIS) 5 MG TABS tablet Take 1 tablet (5 mg total) by mouth 2 (two) times daily. 60 tablet 11   EPINEPHrine 0.3 mg/0.3 mL IJ SOAJ injection Inject into the muscle.     famotidine (PEPCID) 20 MG tablet Take 1 tablet (20 mg total) by mouth at bedtime. (Patient taking differently: Take 20 mg by mouth daily as needed for heartburn.)     gabapentin (NEURONTIN) 100 MG capsule Take 2 capsules (200 mg total) by mouth 2 (two) times daily. 120 capsule 11   lidocaine-prilocaine (EMLA) cream Apply 1 application. topically See admin instructions. Apply as needed for port access     pantoprazole (PROTONIX) 40 MG tablet Take 1 tablet by mouth daily. 30 tablet 3   polyethylene glycol powder (GLYCOLAX/MIRALAX) 17 GM/SCOOP powder Take 17 g by mouth daily as needed for mild constipation.     traZODone (DESYREL) 50 MG tablet Take 25 mg by mouth at bedtime as needed for sleep.  valACYclovir (VALTREX) 500 MG tablet Take 2,000 mg by mouth See admin instructions. Loading dose of 2,000 mg BID for 1 day PRN cold sore.     No current facility-administered medications for this visit.    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 She has never undergone biopsy of the retroperitoneal tissue and its been historically activated to her multiple abdominal surgeries. MRI scan on 06/2020 showed fatty infiltration of the retroperitoneal space surrounding the aorta and IVC to the bifurcation with subcentimeter retroperitoneal nodes that appeared stable to marginally improved from prior CT, consistent with retroperitoneal fibrosis. At her most recent visit in November, she reported that she was overall doing well, continues to have chronic intermittent right upper quadrant abdominal pain. She had a repeat CT scan in November 2022 which showed an abnormal appearance of the left ovary with somewhat masslike appearance and ill-defined margins. She then had a follow-up ultrasound in November 2022 that showed a moderately lobulated mass within the left adnexa that was solid with mild peripheral and central vascularity, measuring about 4 cm, O-RADS 4, no findings of ascites. She has a history of endometriosis (s/p [facility]SC ovarian cystectomies for several endometriomas and is s/p [facility] hysterectomy).   07/09/2021 Imaging   Ct abdomen and pelvis Abnormal appearance of the left ovary with somewhat mass like appearance and ill defined margins, possibly neoplasm.  Recommend pelvic ultrasound for further evaluation.   Infiltrative changes of the retroperitoneum, similar compared with previous examination, consistent with known retroperitoneal fibrosis     09/19/2021 Pathology Results   A. Ovary and fallopian tube, left, salpingo-oophorectomy - Endometrioid adenocarcinoma, seromucinous type, FIGO grade 1, present as multiple foci of invasive carcinoma arising in a background of serousmucinous borderline tumor, see comment and synoptic report -Ovarian surface  involvement is identified -Left fallopian tube with focal endometriosis and focal foreign body giant cell reaction consistent with prior surgical intervention, no involvement by tumor identified   B.  Ovary and Fallopian tube, right, salpingo-oophorectomy -Serous cystadenoma, 1.7 cm, right ovary  -Background ovary with focal endometriosis and surface adhesions -Right fallopian tube with serosal adhesions -No involvement by tumor is identified   C.  Omental biopsy -Benign adipose tissue, no involvement by tumor identified   09/19/2021 Surgery   Procedures: Robotic assisted bilateral salpingo-oophorectomy (CPT [id number]), omental biopsy, bilateral ureterolysis (CPT 06269), lysis of adhesions  Surgeon: Cindie Laroche, MD  Assistants: Bernadene Bell, MD - Fellow, Deeann Cree, MD - Resident Findings: On entry to abdomen, dense adhesions of the omentum to the anterior abdominal wall, unable to visualize upper abdomen, specifically unable to visualize the diaphragm, liver, stomach. Omentum grossly normal apart from adhesions. Entry site inspected with no trauma to surrounding structures. Bilateral complex ovarian masses, left approximately 4cm and right 3cm. Bilateral ovarian masses densely adherent to the pelvic sidewalls, and the right mass adherent to the mesentery of the rectum. Uterus and cervix surgically absent. Retroperitoneal fibrosis bilaterally requiring ureterolysis from the pelvic brim to the insertion into the bladder bilaterally. IOFS c/w borderline tumor.   Specimens:  ID Type Source Tests Collected by Time Destination  1 : left tube and ovary Tissue Ovary, Left SURGICAL PATHOLOGY EXAM Bernadene Bell, MD 09/19/2021 [phone]  2 : pelvic washing Washing Pelvic cavity CYTOLOGY - EXAM FROM OPTIME Christella Hartigan, MD 09/19/2021  0953  3 : right ovary and tube Tissue Ovary, Right SURGICAL PATHOLOGY EXAM Christella Hartigan, MD 09/19/2021 1012  4 : omental biopsy Tissue Omentum SURGICAL PATHOLOGY  EXAM Christella Hartigan, MD 09/19/2021 1012    10/29/2021 Procedure   Successful placement of a single lumen Power port catheter in the right internal jugular vein under ultrasound and fluoroscopy at [facility]   10/30/2021 -  Chemotherapy   She received first cycle of carboplatin and taxol at [facility]   11/04/2021 Initial Diagnosis   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 Right-sided venous Port-A-Cath positioning, as described above. 2. No acute or active cardiopulmonary disease.   11/19/2021 - 12/31/2021 Chemotherapy   Patient is on Treatment Plan : OVARIAN Carboplatin (AUC 6) / Paclitaxel (175) q21d x 4 cycles (including 1 cycle from [facility])     01/02/2022 Genetic Testing   Negative genetic testing on the cancerNext-Expanded+RNAinsight panel.  The report date is Jan 02, 2022.  The CancerNext-Expanded gene panel offered by [facility] and includes sequencing and rearrangement analysis for the following 77 genes: AIP, ALK, APC*, ATM*, AXIN2, BAP1, BARD1, BLM, BMPR1A, BRCA1*, BRCA2*, BRIP1*, CDC73, CDH1*, CDK4, CDKN1B, CDKN2A, CHEK2*, CTNNA1, DICER1, FANCC, FH, FLCN, GALNT12, KIF1B, LZTR1, MAX, MEN1, MET, MLH1*, MSH2*, MSH3, MSH6*, MUTYH*, NBN, NF1*, NF2, NTHL1, PALB2*, PHOX2B, PMS2*, POT1, PRKAR1A, PTCH1, PTEN*, RAD51C*, RAD51D*, RB1, RECQL, RET, SDHA, SDHAF2, SDHB, SDHC, SDHD, SMAD4, SMARCA4, SMARCB1, SMARCE1, STK11, SUFU, TMEM127, TP53*, TSC1, TSC2, VHL and XRCC2 (sequencing and deletion/duplication); EGFR, EGLN1, HOXB13, KIT, MITF, PDGFRA, POLD1, and POLE (sequencing only); EPCAM and GREM1 (deletion/duplication only). DNA and RNA analyses performed for * genes.    01/07/2022 Imaging   1. Acute  pulmonary embolus in the right middle lobe pulmonary artery and segmental branches to the right and left lower lobes. No evidence for right heart strain. RV/LV ratio is 0.78. 2. Atelectasis in the lung bases. Component of pulmonary infarct in the right lower lobe not excluded. 3. Small bilateral pulmonary nodules measuring up to 8 mm.   02/21/2022 Imaging   1. Small LEFT LEFT lower lobe pulmonary nodule approximally 8 mm greatest axial dimension is not changed in the very short interval but warrants attention on subsequent imaging. Comparison with more remote prior exams may be helpful to determine whether this is a finding of significance. Consider short interval follow-up if this is not documented on more remote imaging to ensure stability. 2. Area in the porta hepatis reflects biliary to enteric anastomosis following biliary reconstruction with small bowel anastomosis in the upper abdomen. 3. Stranding and or tiny areas of nodal tissue around the aorta. This is nonspecific but can be seen in the setting of early retroperitoneal fibrosis, apparently this patient has been followed for this finding since 2013. No remote priors are available to allow for comparison at this time. 4. No definitive signs of metastatic or recurrent disease.     02/21/2022 Tumor Marker   Patient's tumor was tested for the following markers: CA-125. Results of the tumor marker test revealed 23.9.     PHYSICAL EXAMINATION: ECOG PERFORMANCE STATUS: 1 - Symptomatic but completely ambulatory  Vitals:   02/25/22 1010  BP: 110/76  Pulse: 65  Resp:  18  Temp: (!) 97.4 F (36.3 C)  SpO2: 100%   Filed Weights   02/25/22 1010  Weight: 155 lb 9.6 oz (70.6 kg)    GENERAL:alert, no distress and comfortable   NEURO: alert & oriented x 3 with fluent speech, no focal motor/sensory deficits  LABORATORY DATA:  I have reviewed the data as listed    Component Value Date/Time   NA 140 02/20/2022 1044   K 3.9 02/20/2022  1044   CL 107 02/20/2022 1044   CO2 26 02/20/2022 1044   GLUCOSE 96 02/20/2022 1044   BUN 15 02/20/2022 1044   CREATININE 0.79 02/20/2022 1044   CREATININE 0.84 01/21/2022 0923   CALCIUM 9.4 02/20/2022 1044   PROT 7.0 02/20/2022 1044   ALBUMIN 4.5 02/20/2022 1044   AST 19 02/20/2022 1044   AST 14 (L) 01/21/2022 0923   ALT 25 02/20/2022 1044   ALT 18 01/21/2022 0923   ALKPHOS 71 02/20/2022 1044   BILITOT 0.4 02/20/2022 1044   BILITOT 0.4 01/21/2022 0923   GFRNONAA >60 02/20/2022 1044   GFRNONAA >60 01/21/2022 0923    No results found for: "SPEP", "UPEP"  Lab Results  Component Value Date   WBC 7.3 02/20/2022   NEUTROABS 4.8 02/20/2022   HGB 11.8 (L) 02/20/2022   HCT 35.5 (L) 02/20/2022   MCV 92.2 02/20/2022   PLT 270 02/20/2022      Chemistry      Component Value Date/Time   NA 140 02/20/2022 1044   K 3.9 02/20/2022 1044   CL 107 02/20/2022 1044   CO2 26 02/20/2022 1044   BUN 15 02/20/2022 1044   CREATININE 0.79 02/20/2022 1044   CREATININE 0.84 01/21/2022 0923      Component Value Date/Time   CALCIUM 9.4 02/20/2022 1044   ALKPHOS 71 02/20/2022 1044   AST 19 02/20/2022 1044   AST 14 (L) 01/21/2022 0923   ALT 25 02/20/2022 1044   ALT 18 01/21/2022 0923   BILITOT 0.4 02/20/2022 1044   BILITOT 0.4 01/21/2022 0923       RADIOGRAPHIC STUDIES: I have personally reviewed the radiological images as listed and agreed with the findings in the report. CT CHEST ABDOMEN PELVIS W CONTRAST  Result Date: 02/20/2022 CLINICAL DATA:  A 56 year old female presents for evaluation of ovarian cancer. * Tracking Code: BO * EXAM: CT CHEST, ABDOMEN, AND PELVIS WITH CONTRAST TECHNIQUE: Multidetector CT imaging of the chest, abdomen and pelvis was performed following the standard protocol during bolus administration of intravenous contrast. RADIATION DOSE REDUCTION: This exam was performed according to the departmental dose-optimization program which includes automated exposure  control, adjustment of the mA and/or kV according to patient size and/or use of iterative reconstruction technique. CONTRAST:  137m OMNIPAQUE IOHEXOL 300 MG/ML  SOLN COMPARISON:  CT of the chest of Jan 07, 2022. FINDINGS: CT CHEST FINDINGS Cardiovascular: RIGHT-sided Port-A-Cath terminates at the caval to atrial junction. Heart size is normal without pericardial effusion or nodularity. Aortic caliber is normal. Central pulmonary vasculature is of normal caliber. Pulmonary emboli seen on the previous exam are not demonstrated on the current venous phase study. Mediastinum/Nodes: Mild increased density within the fat of the anterior mediastinum is triangular and does not disturb the normal contour of the mediastinum anteriorly likely related to mild thymic hyperplasia. No signs of adenopathy in the chest. Esophagus grossly normal. Lungs/Pleura: Small nodule along the fissure in the LEFT chest in the LEFT lower lobe (image 69/4) 8 x 6 mm this is within  1 mm of previous imaging. No consolidation or effusion. Airways are patent. Musculoskeletal: See below for full musculoskeletal details. CT ABDOMEN PELVIS FINDINGS Hepatobiliary: 2.5 cm LEFT hepatic lobe lesion with well-circumscribed margins is the largest low-attenuation lesion in the liver. Other scattered areas of low attenuation are stable to the extent evaluated on the previous study. Post cholecystectomy. Low attenuation material in the gallbladder fossa measuring 3.6 x 1.9 cm (image 55/2) no substantial biliary duct distension with signs of pneumobilia that are similar to previous imaging. Gallbladder was not imaged on the prior study. Pancreas: Normal, without mass, inflammation or ductal dilatation. Spleen: Normal. Adrenals/Urinary Tract: Adrenal glands are normal. Symmetric renal enhancement. Mild RIGHT ureteral dilation in the proximal ureter. Urinary bladder is unremarkable, without thickening or perivesical stranding. No hydronephrosis or perinephric  stranding. No suspicious renal lesion. Stomach/Bowel: Small bowel anastomosis in the central abdomen without signs of small bowel obstruction or acute small bowel process. Appendix not visualized, no secondary signs to suggest acute appendicitis, likely surgically absent. No acute colonic process. Vascular/Lymphatic: Scattered lymph nodes throughout the retroperitoneum no distinct lymph node with pathologic enlargement. Stranding and or tiny areas of nodal tissue around the aorta. No pelvic sidewall lymphadenopathy. Reproductive: Post hysterectomy. No discrete adnexal mass. No pelvic mass. No pelvic ascites. Other: No intra-abdominal ascites.  No peritoneal nodularity. Musculoskeletal: No acute or significant osseous findings. IMPRESSION: 1. Small LEFT LEFT lower lobe pulmonary nodule approximally 8 mm greatest axial dimension is not changed in the very short interval but warrants attention on subsequent imaging. Comparison with more remote prior exams may be helpful to determine whether this is a finding of significance. Consider short interval follow-up if this is not documented on more remote imaging to ensure stability. 2. Area in the porta hepatis reflects biliary to enteric anastomosis following biliary reconstruction with small bowel anastomosis in the upper abdomen. 3. Stranding and or tiny areas of nodal tissue around the aorta. This is nonspecific but can be seen in the setting of early retroperitoneal fibrosis, apparently this patient has been followed for this finding since 2013. No remote priors are available to allow for comparison at this time. 4. No definitive signs of metastatic or recurrent disease. Electronically Signed   By: Zetta Bills M.D.   On: 02/20/2022 15:13

## 2022-02-25 NOTE — Assessment & Plan Note (Signed)
She has no signs of residual disease We discussed timing of port removal The patient does not desire to return back to [facility] for follow-up I will get her seen by one of our GYN surgeon here

## 2022-02-25 NOTE — Assessment & Plan Note (Signed)
She has intermittent hot flashes Gabapentin seems to help She also have vaginal dryness I will prescribe topical estrogen cream

## 2022-02-25 NOTE — Assessment & Plan Note (Signed)
She will continue treatment for minimum 6 months of anticoagulation therapy She is doing well with no signs of bleeding

## 2022-02-28 ENCOUNTER — Telehealth: Payer: Self-pay | Admitting: *Deleted

## 2022-02-28 NOTE — Telephone Encounter (Signed)
Scheduled the patient to see Dr Berline Lopes on 8/24

## 2022-03-26 ENCOUNTER — Encounter: Payer: Self-pay | Admitting: Hematology and Oncology

## 2022-04-09 ENCOUNTER — Encounter: Payer: Self-pay | Admitting: Gynecologic Oncology

## 2022-04-09 ENCOUNTER — Telehealth: Payer: Self-pay | Admitting: *Deleted

## 2022-04-09 ENCOUNTER — Encounter: Payer: Self-pay | Admitting: Hematology and Oncology

## 2022-04-09 NOTE — Telephone Encounter (Signed)
[facility] [phone], Angela Cox, rep)  Patient ID # [id number] Eliquis 5 mg BID, quantity #60/30 day authorized  03/10/22 through 04/09/23 Prior Auth/Case # [id number]  Patient and pharmacy informed  PA fax received from [facility] sent to HIM to be scanned into patient chart

## 2022-04-10 ENCOUNTER — Inpatient Hospital Stay: Payer: 59 | Attending: Hematology and Oncology | Admitting: Gynecologic Oncology

## 2022-04-10 ENCOUNTER — Ambulatory Visit: Payer: 59 | Admitting: Gynecologic Oncology

## 2022-04-10 ENCOUNTER — Inpatient Hospital Stay: Payer: 59

## 2022-04-10 ENCOUNTER — Telehealth: Payer: Self-pay

## 2022-04-10 ENCOUNTER — Other Ambulatory Visit: Payer: Self-pay

## 2022-04-10 VITALS — BP 123/72 | HR 82 | Temp 98.6°F | Resp 16 | Ht 63.0 in | Wt 156.1 lb

## 2022-04-10 DIAGNOSIS — R918 Other nonspecific abnormal finding of lung field: Secondary | ICD-10-CM

## 2022-04-10 DIAGNOSIS — N951 Menopausal and female climacteric states: Secondary | ICD-10-CM | POA: Insufficient documentation

## 2022-04-10 DIAGNOSIS — Z90722 Acquired absence of ovaries, bilateral: Secondary | ICD-10-CM | POA: Diagnosis not present

## 2022-04-10 DIAGNOSIS — N941 Unspecified dyspareunia: Secondary | ICD-10-CM | POA: Diagnosis not present

## 2022-04-10 DIAGNOSIS — R350 Frequency of micturition: Secondary | ICD-10-CM

## 2022-04-10 DIAGNOSIS — Z9221 Personal history of antineoplastic chemotherapy: Secondary | ICD-10-CM | POA: Insufficient documentation

## 2022-04-10 DIAGNOSIS — M79661 Pain in right lower leg: Secondary | ICD-10-CM | POA: Diagnosis not present

## 2022-04-10 DIAGNOSIS — M7989 Other specified soft tissue disorders: Secondary | ICD-10-CM

## 2022-04-10 DIAGNOSIS — C562 Malignant neoplasm of left ovary: Secondary | ICD-10-CM

## 2022-04-10 DIAGNOSIS — R600 Localized edema: Secondary | ICD-10-CM | POA: Diagnosis not present

## 2022-04-10 DIAGNOSIS — R42 Dizziness and giddiness: Secondary | ICD-10-CM | POA: Insufficient documentation

## 2022-04-10 DIAGNOSIS — I2699 Other pulmonary embolism without acute cor pulmonale: Secondary | ICD-10-CM | POA: Insufficient documentation

## 2022-04-10 LAB — URINALYSIS, COMPLETE (UACMP) WITH MICROSCOPIC
Bacteria, UA: NONE SEEN
Bilirubin Urine: NEGATIVE
Glucose, UA: NEGATIVE mg/dL
Hgb urine dipstick: NEGATIVE
Ketones, ur: NEGATIVE mg/dL
Leukocytes,Ua: NEGATIVE
Nitrite: NEGATIVE
Protein, ur: NEGATIVE mg/dL
Specific Gravity, Urine: 1.017 (ref 1.005–1.030)
pH: 6 (ref 5.0–8.0)

## 2022-04-10 NOTE — Telephone Encounter (Signed)
Told Kristine White that Dr. Berline Lopes said  that her urine looks pristine so she did not order a urine culture. Pt verbalized understanding.

## 2022-04-10 NOTE — Patient Instructions (Addendum)
It was very nice to meet you today.  I will let you know your urine results once back.  Please look over the Effexor information.  If you would like to try this for your hot flashes, please let me know -you can either send a MyChart message or call the clinic.  Plan would be to use a dose of 37.5 mg daily.  I have placed an order for a lower extremity Doppler to look at that right leg.  I have also placed a referral for pelvic floor physical therapy.  I will plan to see you back in February for follow-up.  Please call sometime after the new year to schedule a visit to see me.  Please do not hesitate to reach out in the meantime if you need anything.

## 2022-04-10 NOTE — Progress Notes (Signed)
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 She has never undergone biopsy of the retroperitoneal tissue and its been historically activated to her multiple abdominal surgeries. MRI scan on 06/2020 showed fatty infiltration of the retroperitoneal space surrounding the aorta and IVC to the bifurcation with subcentimeter retroperitoneal nodes that appeared stable to marginally improved from prior CT, consistent with retroperitoneal fibrosis. At her most recent visit in November, she reported that she was overall doing well, continues to have chronic intermittent right upper quadrant abdominal pain. She had a repeat CT scan in November 2022 which showed an abnormal appearance of the left ovary with somewhat masslike appearance and ill-defined margins. She then had a follow-up ultrasound in November 2022 that showed a moderately lobulated mass within the left adnexa that was solid with mild peripheral and central vascularity, measuring about 4 cm, O-RADS 4, no findings of ascites. She has a history of endometriosis (s/p [facility] ovarian cystectomies for several endometriomas and is s/p [facility] hysterectomy).   07/09/2021 Imaging   Ct abdomen and pelvis Abnormal appearance of the left ovary with somewhat mass like appearance and ill defined margins, possibly neoplasm.  Recommend pelvic ultrasound for further evaluation.   Infiltrative changes of the retroperitoneum, similar compared with previous examination, consistent with known retroperitoneal fibrosis     09/19/2021 Pathology Results   A. Ovary and fallopian tube, left, salpingo-oophorectomy - Endometrioid adenocarcinoma, seromucinous type, FIGO grade 1,  present as multiple foci of invasive carcinoma arising in a background of serousmucinous borderline tumor, see comment and synoptic report -Ovarian surface involvement is identified -Left fallopian tube with focal endometriosis and focal foreign body giant cell reaction consistent with prior surgical intervention, no involvement by tumor identified   B.  Ovary and Fallopian tube, right, salpingo-oophorectomy -Serous cystadenoma, 1.7 cm, right ovary  -Background ovary with focal endometriosis and surface adhesions -Right fallopian tube with serosal adhesions -No involvement by tumor is identified   C.  Omental biopsy -Benign adipose tissue, no involvement by tumor identified   09/19/2021 Surgery   Procedures: Robotic assisted bilateral salpingo-oophorectomy (CPT [phone]), omental biopsy, bilateral ureterolysis (CPT 34196), lysis of adhesions  Surgeon: Cindie Laroche, MD  Assistants: Bernadene Bell, MD - Fellow, Deeann Cree, MD - Resident Findings: On entry to abdomen, dense adhesions of the omentum to the anterior abdominal wall, unable to visualize upper abdomen, specifically unable to visualize the diaphragm, liver, stomach. Omentum grossly normal apart from adhesions. Entry site inspected with no trauma to surrounding structures. Bilateral complex ovarian masses, left approximately 4cm and right 3cm. Bilateral ovarian masses densely adherent to the pelvic sidewalls, and the right mass adherent to the mesentery of the rectum. Uterus and cervix surgically absent. Retroperitoneal fibrosis bilaterally requiring ureterolysis from the pelvic brim to the insertion into the bladder bilaterally. IOFS c/w borderline tumor.   Specimens:  ID Type Source Tests Collected by Time Destination  1 : left tube and ovary Tissue Ovary, Left SURGICAL PATHOLOGY EXAM Bernadene Bell, MD 09/19/2021 [phone]  2 : pelvic washing Washing Pelvic cavity CYTOLOGY - EXAM FROM OPTIME Christella Hartigan, MD 09/19/2021 [phone]  3 : right  ovary and tube Tissue Ovary, Right SURGICAL PATHOLOGY EXAM Christella Hartigan, MD 09/19/2021 1012  4 : omental biopsy Tissue Omentum  SURGICAL PATHOLOGY EXAM Christella Hartigan, MD 09/19/2021 1012    10/29/2021 Procedure   Successful placement of a single lumen Power port catheter in the right internal jugular vein under ultrasound and fluoroscopy at [facility]   10/30/2021 -  Chemotherapy   She received first cycle of carboplatin and taxol at [facility]   11/04/2021 Initial Diagnosis   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 Right-sided venous Port-A-Cath positioning, as described above. 2. No acute or active cardiopulmonary disease.   11/19/2021 - 12/31/2021 Chemotherapy   Patient is on Treatment Plan : OVARIAN Carboplatin (AUC 6) / Paclitaxel (175) q21d x 4 cycles (including 1 cycle from [facility])     01/02/2022 Genetic Testing   Negative genetic testing on the cancerNext-Expanded+RNAinsight panel.  The report date is Jan 02, 2022.  The CancerNext-Expanded gene panel offered by [facility] and includes sequencing and rearrangement analysis for the following 77 genes: AIP, ALK, APC*, ATM*, AXIN2, BAP1, BARD1, BLM, BMPR1A, BRCA1*, BRCA2*, BRIP1*, CDC73, CDH1*, CDK4, CDKN1B, CDKN2A, CHEK2*, CTNNA1, DICER1, FANCC, FH, FLCN, GALNT12, KIF1B, LZTR1, MAX, MEN1, MET, MLH1*, MSH2*, MSH3, MSH6*, MUTYH*, NBN, NF1*, NF2, NTHL1, PALB2*, PHOX2B, PMS2*, POT1, PRKAR1A, PTCH1, PTEN*, RAD51C*, RAD51D*, RB1, RECQL, RET, SDHA, SDHAF2, SDHB, SDHC, SDHD, SMAD4, SMARCA4, SMARCB1, SMARCE1, STK11, SUFU, TMEM127, TP53*, TSC1, TSC2, VHL and XRCC2 (sequencing and deletion/duplication); EGFR, EGLN1, HOXB13, KIT, MITF, PDGFRA, POLD1, and  POLE (sequencing only); EPCAM and GREM1 (deletion/duplication only). DNA and RNA analyses performed for * genes.    01/07/2022 Imaging   1. Acute pulmonary embolus in the right middle lobe pulmonary artery and segmental branches to the right and left lower lobes. No evidence for right heart strain. RV/LV ratio is 0.78. 2. Atelectasis in the lung bases. Component of pulmonary infarct in the right lower lobe not excluded. 3. Small bilateral pulmonary nodules measuring up to 8 mm.   02/21/2022 Imaging   1. Small LEFT LEFT lower lobe pulmonary nodule approximally 8 mm greatest axial dimension is not changed in the very short interval but warrants attention on subsequent imaging. Comparison with more remote prior exams may be helpful to determine whether this is a finding of significance. Consider short interval follow-up if this is not documented on more remote imaging to ensure stability. 2. Area in the porta hepatis reflects biliary to enteric anastomosis following biliary reconstruction with small bowel anastomosis in the upper abdomen. 3. Stranding and or tiny areas of nodal tissue around the aorta. This is nonspecific but can be seen in the setting of early retroperitoneal fibrosis, apparently this patient has been followed for this finding since 2013. No remote priors are available to allow for comparison at this time. 4. No definitive signs of metastatic or recurrent disease.     02/21/2022 Tumor Marker   Patient's tumor was tested for the following markers: CA-125. Results of the tumor marker test revealed 23.9.     Interval History: Patient reports overall doing well since finishing chemotherapy.  Her fatigue has slowly improved.  She endorses a good appetite without nausea or emesis.  She reports normal bowel function.  She describes 4 days of urinary frequency as well as occasional twinges of pain in her right lower quadrant.  Denies other urinary symptoms.  She recently attempted  intercourse with her husband, notes that this was very painful.  Continues to have hot flashes with associated symptoms such as dizziness.  Has not started vaginal estrogen.  Has noted some right lower leg swelling and calf pain for the last week.  Past Medical/Surgical History: Past Medical History:  Diagnosis Date   Asthma    Bilateral pulmonary embolism ([place]) 01/07/2022   Endometriosis    Family history of breast cancer    Family history of ovarian cancer    GERD (gastroesophageal reflux disease)    IBS (irritable bowel syndrome)    Idiopathic angioedema    Juvenile polyp of colon    Juvenile polyposis syndrome    Pulmonary nodules 01/07/2022   Retroperitoneal fibrosis     Past Surgical History:  Procedure Laterality Date   ABDOMINAL HYSTERECTOMY     APPENDECTOMY     bile duct reconstruction     CHOLECYSTECTOMY     CHOLEDOCHOJEJUNOSTOMY     pancreatic duct surgery     POLYPECTOMY     TONSILLECTOMY      Family History  Problem Relation Age of Onset   Breast cancer Mother 29   Ovarian cancer Mother 29   Cancer Father        scrotal cancer   Lung cancer Father    Breast cancer Other    Kidney cancer Other    Breast cancer Half-Sister 18       Neg GT    Social History   Socioeconomic History   Marital status: Married    Spouse name: Tana Felts   Number of children: 2   Years of education: Not on file   Highest education level: Not on file  Occupational History   Occupation: clinical nurse specialist  Tobacco Use   Smoking status: Never   Smokeless tobacco: Never  Substance and Sexual Activity   Alcohol use: Not Currently   Drug use: Never   Sexual activity: Yes  Other Topics Concern   Not on file  Social History Narrative   Not on file   Social Determinants of Health   Financial Resource Strain: Not on file  Food Insecurity: Not on file  Transportation Needs: Not on file  Physical Activity: Not on file  Stress: Not on file  Social Connections:  Not on file    Current Medications:  Current Outpatient Medications:    acetaminophen (TYLENOL) 500 MG tablet, Take 1,000 mg by mouth every 6 (six) hours as needed for mild pain., Disp: , Rfl:    apixaban (ELIQUIS) 5 MG TABS tablet, Take 1 tablet (5 mg total) by mouth 2 (two) times daily., Disp: 60 tablet, Rfl: 11   EPINEPHrine 0.3 mg/0.3 mL IJ SOAJ injection, Inject into the muscle., Disp: , Rfl:    famotidine (PEPCID) 20 MG tablet, Take 1 tablet (20 mg total) by mouth at bedtime. (Patient taking differently: Take 20 mg by mouth daily as needed for heartburn.), Disp: , Rfl:    gabapentin (NEURONTIN) 100 MG capsule, Take 2 capsules (200 mg total) by mouth 2 (two) times daily., Disp: 120 capsule, Rfl: 11   traZODone (DESYREL) 50 MG tablet, Take 25 mg by mouth at bedtime as needed for sleep., Disp: , Rfl:    valACYclovir (VALTREX) 500 MG tablet, Take 2,000 mg by mouth See admin instructions. Loading dose of 2,000 mg BID for 1 day PRN cold sore., Disp: , Rfl:    estradiol (ESTRACE VAGINAL) 0.1  MG/GM vaginal cream, Place 1 Applicatorful vaginally at bedtime. (Patient not taking: Reported on 04/09/2022), Disp: 42.5 g, Rfl: 3   lidocaine-prilocaine (EMLA) cream, Apply 1 application. topically See admin instructions. Apply as needed for port access (Patient not taking: Reported on 04/09/2022), Disp: , Rfl:    pantoprazole (PROTONIX) 40 MG tablet, Take 1 tablet by mouth daily. (Patient not taking: Reported on 04/09/2022), Disp: 30 tablet, Rfl: 3  Review of Systems: + Abdominal pain, dyspareunia, urinary frequency, right calf muscle pain and swelling. Denies appetite changes, fevers, chills, fatigue, unexplained weight changes. Denies hearing loss, neck lumps or masses, mouth sores, ringing in ears or voice changes. Denies cough or wheezing.  Denies shortness of breath. Denies chest pain or palpitations. Denies leg swelling. Denies abdominal distention, blood in stools, constipation, diarrhea, nausea,  vomiting, or early satiety. Denies dysuria, hematuria or incontinence. Denies hot flashes, pelvic pain, vaginal bleeding or vaginal discharge.   Denies joint pain, back pain. Denies itching, rash, or wounds. Denies dizziness, headaches, numbness or seizures. Denies swollen lymph nodes or glands, denies easy bruising or bleeding. Denies anxiety, depression, confusion, or decreased concentration.  Physical Exam: BP 123/72 (BP Location: Left Arm, Patient Position: Sitting)   Pulse 82   Temp 98.6 F (37 C) (Oral)   Resp 16   Ht 5' 3"  (1.6 m)   Wt 156 lb 1.6 oz (70.8 kg)   SpO2 96%   BMI 27.65 kg/m  General: Alert, oriented, no acute distress. HEENT: Normocephalic, atraumatic, sclera anicteric. Chest: Clear to auscultation bilaterally.  No wheezes or rhonchi. Cardiovascular: Regular rate and rhythm, no murmurs. Abdomen: soft, nontender.  Normoactive bowel sounds.  No masses or hepatosplenomegaly appreciated.  Well-healed scar. Extremities: Grossly normal range of motion.  Warm, well perfused.  Trace edema of right lower extremity. Skin: No rashes or lesions noted. Lymphatics: No cervical, supraclavicular, or inguinal adenopathy. GU: Normal appearing external genitalia without erythema, excoriation, or lesions.  Speculum exam reveals mildly atrophic vaginal mucosa, no lesions or masses noted.  Bimanual exam reveals no masses or nodularity.  Rectovaginal exam confirms these findings.  Laboratory & Radiologic Studies: CA-125 on 02/20/22: 23.9  CEA (1.2) and CA-125 (18) both normal in 07/2021.  Assessment & Plan: Kristine White is a 56 y.o. 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 Genetic testing negative.  Patient is overall doing well and is NED on exam today.  Gust most recent CT scan.  While overall reassuring, given several findings without prior imaging, recommended  repeat imaging 3-6 months after to establish stable findings.  This was scheduled today.  Discussed vaginal and menopausal symptoms.  Discussed treatment options including starting hormone replacement therapy.  She is somewhat worried about being on estrogen in the setting of her endometrioid tumor.  Tumor cells from her surgery in February were patchy positive for ER.  Although early stage disease, she is worried about stimulation by being on systemic estrogen.  I think it would be reasonable to start her on low-dose estrogen for symptom control.  I think it would be safe for her to be on vaginal estrogen for her vaginal symptoms.  Patient's preference is to start with nonhormonal replacement options first.  We discussed Effexor as well as pelvic floor physical therapy.  She was given some information to read about Effexor and will let me know if she would like to start this.  Referral placed  for pelvic floor physical therapy and the patient was given some samples of vaginal lubrication.   Given urinary symptoms, urinalysis will be sent today.  If concerning for infection, will add urine culture.  CA-125 not appear to be a tumor marker for her.  It was normal prior to surgery.  Per NCCN surveillance recommendations, discussed recommendation for visits every 3 months for the first 2 years and then consideration of transitioning to visits every 4-6 months.  We reviewed signs and symptoms that would be concerning for cancer recurrence and I stressed the importance of calling if she develops any of these before her next scheduled visit.  Plan for anticoagulation per Dr. Alvy Bimler in the setting of pulmonary embolism.  Given right calf and lower leg symptoms, lower extremity Doppler ordered.  32 minutes of total time was spent for this patient encounter, including preparation, face-to-face counseling with the patient and coordination of care, and documentation of the encounter.  Jeral Pinch, MD   Division of Gynecologic Oncology  Department of Obstetrics and Gynecology  [facility]

## 2022-04-11 ENCOUNTER — Telehealth: Payer: Self-pay | Admitting: *Deleted

## 2022-04-11 ENCOUNTER — Ambulatory Visit (HOSPITAL_COMMUNITY)
Admission: RE | Admit: 2022-04-11 | Discharge: 2022-04-11 | Disposition: A | Discharge status: Home / Self Care | Payer: 59 | Source: Ambulatory Visit | Attending: Gynecologic Oncology | Admitting: Gynecologic Oncology

## 2022-04-11 ENCOUNTER — Other Ambulatory Visit: Payer: Self-pay | Admitting: Gynecologic Oncology

## 2022-04-11 ENCOUNTER — Encounter: Payer: Self-pay | Admitting: Gynecologic Oncology

## 2022-04-11 DIAGNOSIS — F418 Other specified anxiety disorders: Secondary | ICD-10-CM

## 2022-04-11 DIAGNOSIS — M7989 Other specified soft tissue disorders: Secondary | ICD-10-CM | POA: Insufficient documentation

## 2022-04-11 MED ORDER — VENLAFAXINE HCL ER 37.5 MG PO CP24: 37.5000 mg | ORAL_CAPSULE | Freq: Every day | ORAL | 6 refills | Status: DC

## 2022-04-11 NOTE — Progress Notes (Signed)
New prescription for effexor 37.5 XL sent in per Dr. Charisse March request.

## 2022-04-11 NOTE — Progress Notes (Signed)
I called the patient in response to her my chart message.  I answered the questions that she had after her clinic visit. Reiterated that I do not see findings on her recent CT scan that I think represent recurrent or metastatic disease. I think establishing no change in these findings as her baseline would be helpful, which is why I suggested repeating a CT scan. we talked again about the possibility of starting Effexor for her symptoms . She would like to try this. I will send a prescription into her pharmacy.

## 2022-04-11 NOTE — Telephone Encounter (Signed)
Spoke with pt today who stated that she use the pharmacy at [id number] [place], [address] [place] [address] MJoylene John NP notified.

## 2022-04-15 ENCOUNTER — Encounter: Payer: Self-pay | Admitting: Gynecologic Oncology

## 2022-04-30 ENCOUNTER — Encounter: Payer: Self-pay | Admitting: Hematology and Oncology

## 2022-05-01 ENCOUNTER — Ambulatory Visit: Payer: 59 | Admitting: Physical Therapy

## 2022-05-01 NOTE — Therapy (Deleted)
OUTPATIENT PHYSICAL THERAPY FEMALE PELVIC EVALUATION   Patient Name: Kristine White MRN: [id number] DOB:Jan 12, 1966, 56 y.o., female Today's Date: 05/01/2022    Past Medical History:  Diagnosis Date   Asthma    Bilateral pulmonary embolism ([place]) 01/07/2022   Endometriosis    Family history of breast cancer    Family history of ovarian cancer    GERD (gastroesophageal reflux disease)    IBS (irritable bowel syndrome)    Idiopathic angioedema    Juvenile polyp of colon    Juvenile polyposis syndrome    Pulmonary nodules 01/07/2022   Retroperitoneal fibrosis    Past Surgical History:  Procedure Laterality Date   ABDOMINAL HYSTERECTOMY     APPENDECTOMY     bile duct reconstruction     CHOLECYSTECTOMY     CHOLEDOCHOJEJUNOSTOMY     pancreatic duct surgery     POLYPECTOMY     TONSILLECTOMY     Patient Active Problem List   Diagnosis Date Noted   Hot flashes 02/25/2022   Goals of care, counseling/discussion 01/21/2022   Bilateral pulmonary embolism ([place]) 01/07/2022   Elevated ALT measurement 01/07/2022   Hyperglycemia 01/07/2022   Anemia associated with chemotherapy 01/07/2022   Atelectasis 01/07/2022   Pulmonary nodules 01/07/2022   Genetic testing 01/03/2022   Family history of breast cancer 12/11/2021   Family history of ovarian cancer 12/11/2021   Juvenile polyposis syndrome 12/11/2021   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  Rationale for Evaluation and Treatment {HABREHAB:27488}  ONSET DATE: ***  SUBJECTIVE:                                                                                                                                                                                            SUBJECTIVE STATEMENT: *** Fluid intake: {Yes/No:304960894}    PAIN:  Are you having pain? {yes/no:20286} NPRS scale: ***/10 Pain location: {pelvic pain location:27098}  Pain type: {type:313116} Pain description: {PAIN DESCRIPTION:21022940}   Aggravating factors: *** Relieving factors: ***  PRECAUTIONS: {Therapy precautions:24002}  WEIGHT BEARING RESTRICTIONS {Yes ***/No:24003}  FALLS:  Has patient fallen in last 6 months? {fallsyesno:27318}  LIVING ENVIRONMENT: Lives with: {OPRC lives with:25569::"lives with their family"} Lives in: {Lives in:25570} Stairs: {opstairs:27293} Has following equipment at home: {Assistive devices:23999}  OCCUPATION: ***  PLOF: {PLOF:24004}  PATIENT GOALS ***  PERTINENT HISTORY:  PMH: Abdominal  hysterectomy, left ovarian cancer Sexual abuse: {Yes/No:304960894}  BOWEL MOVEMENT Pain with bowel movement: {yes/no:20286} Type of bowel movement:{PT BM type:27100} Fully empty rectum: {Yes/No:304960894} Leakage: {Yes/No:304960894} Pads: {Yes/No:304960894} Fiber supplement: {Yes/No:304960894}  URINATION Pain with urination: {yes/no:20286} Fully empty bladder: {Yes/No:304960894} Stream: {PT urination:27102} Urgency: {Yes/No:304960894} Frequency: *** Leakage: {PT leakage:27103} Pads: {Yes/No:304960894}  INTERCOURSE Pain with intercourse: {pain with intercourse PA:27099} Ability to have vaginal penetration:  {Yes/No:304960894} Climax: *** Marinoff Scale: ***/3  PREGNANCY Vaginal deliveries *** Tearing {Yes***/No:304960894} C-section deliveries *** Currently pregnant {Yes***/No:304960894}  PROLAPSE {PT prolapse:27101}    OBJECTIVE:   DIAGNOSTIC FINDINGS:  ***  PATIENT SURVEYS:  {rehab surveys:24030}  PFIQ-7 ***  COGNITION:  Overall cognitive status: {cognition:24006}     SENSATION:  Light touch: {intact/deficits:24005}  Proprioception: {intact/deficits:24005}  MUSCLE  LENGTH: Hamstrings: Right *** deg; Left *** deg Thomas test: Right *** deg; Left *** deg  LUMBAR SPECIAL TESTS:  {lumbar special test:25242}  FUNCTIONAL TESTS:  {Functional tests:24029}  GAIT: Distance walked: *** Assistive device utilized: {Assistive devices:23999} Level of assistance: {Levels of assistance:24026} Comments: ***               POSTURE: {posture:25561}   PELVIC ALIGNMENT:  LUMBARAROM/PROM  A/PROM A/PROM  eval  Flexion   Extension   Right lateral flexion   Left lateral flexion   Right rotation   Left rotation    (Blank rows = not tested)  LOWER EXTREMITY ROM:  {AROM/PROM:27142} ROM Right eval Left eval  Hip flexion    Hip extension    Hip abduction    Hip adduction    Hip internal rotation    Hip external rotation    Knee flexion    Knee extension    Ankle dorsiflexion    Ankle plantarflexion    Ankle inversion    Ankle eversion     (Blank rows = not tested)  LOWER EXTREMITY MMT:  MMT Right eval Left eval  Hip flexion    Hip extension    Hip abduction    Hip adduction    Hip internal rotation    Hip external rotation    Knee flexion    Knee extension    Ankle dorsiflexion    Ankle plantarflexion    Ankle inversion    Ankle eversion      PALPATION:   General  ***                External Perineal Exam ***                             Internal Pelvic Floor ***  Patient confirms identification and approves PT to assess internal pelvic floor and treatment {yes/no:20286}  PELVIC MMT:   MMT eval  Vaginal   Internal Anal Sphincter   External Anal Sphincter   Puborectalis   Diastasis Recti   (Blank rows = not tested)        TONE: ***  PROLAPSE: ***  TODAY'S TREATMENT  EVAL ***   PATIENT EDUCATION:  Education details: *** Person educated: {Person educated:25204} Education method: {Education Method:25205} Education comprehension: {Education Comprehension:25206}   HOME EXERCISE  PROGRAM: ***  ASSESSMENT:  CLINICAL IMPRESSION: Patient is a *** y.o. *** who was seen today for physical therapy evaluation and treatment for ***.    OBJECTIVE IMPAIRMENTS {opptimpairments:25111}.   ACTIVITY LIMITATIONS {activitylimitations:27494}  PARTICIPATION LIMITATIONS: {participationrestrictions:25113}  PERSONAL FACTORS {Personal factors:25162} are also affecting patient's functional outcome.   REHAB POTENTIAL: {rehabpotential:25112}  CLINICAL  DECISION MAKING: {clinical decision making:25114}  EVALUATION COMPLEXITY: {Evaluation complexity:25115}   GOALS: Goals reviewed with patient? {yes/no:20286}  SHORT TERM GOALS: Target date: {follow up:25551}  *** Baseline: Goal status: {GOALSTATUS:25110}  2.  *** Baseline:  Goal status: {GOALSTATUS:25110}  3.  *** Baseline:  Goal status: {GOALSTATUS:25110}  4.  *** Baseline:  Goal status: {GOALSTATUS:25110}  5.  *** Baseline:  Goal status: {GOALSTATUS:25110}  6.  *** Baseline:  Goal status: {GOALSTATUS:25110}  LONG TERM GOALS: Target date: {follow up:25551}   *** Baseline:  Goal status: {GOALSTATUS:25110}  2.  *** Baseline:  Goal status: {GOALSTATUS:25110}  3.  *** Baseline:  Goal status: {GOALSTATUS:25110}  4.  *** Baseline:  Goal status: {GOALSTATUS:25110}  5.  *** Baseline:  Goal status: {GOALSTATUS:25110}  6.  *** Baseline:  Goal status: {GOALSTATUS:25110}  PLAN: PT FREQUENCY: {rehab frequency:25116}  PT DURATION: {rehab duration:25117}  PLANNED INTERVENTIONS: {rehab planned interventions:25118::"Therapeutic exercises","Therapeutic activity","Neuromuscular re-education","Balance training","Gait training","Patient/Family education","Self Care","Joint mobilization"}  PLAN FOR NEXT SESSION: ***   Camillo Flaming Miliana Gangwer, PT 05/01/2022, 7:51 AM

## 2022-05-10 ENCOUNTER — Other Ambulatory Visit: Payer: Self-pay | Admitting: Gynecologic Oncology

## 2022-05-10 DIAGNOSIS — F418 Other specified anxiety disorders: Secondary | ICD-10-CM

## 2022-05-15 ENCOUNTER — Encounter: Payer: Self-pay | Admitting: Hematology and Oncology

## 2022-05-16 ENCOUNTER — Other Ambulatory Visit: Payer: Self-pay

## 2022-05-16 MED ORDER — APIXABAN 5 MG PO TABS
5.0000 mg | ORAL_TABLET | Freq: Two times a day (BID) | ORAL | 1 refills | Status: DC
Start: 2022-05-16 — End: 2022-05-20

## 2022-05-20 ENCOUNTER — Other Ambulatory Visit (HOSPITAL_COMMUNITY): Payer: Self-pay

## 2022-05-20 ENCOUNTER — Other Ambulatory Visit: Payer: Self-pay

## 2022-05-20 MED ORDER — APIXABAN 5 MG PO TABS
5.0000 mg | ORAL_TABLET | Freq: Two times a day (BID) | ORAL | 1 refills | Status: DC
  Filled 2022-05-20 – 2022-05-21 (×2): qty 60, 30d supply, fill #0

## 2022-05-21 ENCOUNTER — Other Ambulatory Visit (HOSPITAL_COMMUNITY): Payer: Self-pay

## 2022-05-21 ENCOUNTER — Other Ambulatory Visit: Payer: Self-pay | Admitting: *Deleted

## 2022-05-21 MED ORDER — APIXABAN 5 MG PO TABS: 5.0000 mg | ORAL_TABLET | Freq: Two times a day (BID) | ORAL | 1 refills | Status: DC

## 2022-05-21 MED ORDER — APIXABAN 5 MG PO TABS
5.0000 mg | ORAL_TABLET | Freq: Two times a day (BID) | ORAL | 1 refills | Status: DC
Start: 2022-05-21 — End: 2022-05-21

## 2022-05-27 ENCOUNTER — Inpatient Hospital Stay (HOSPITAL_BASED_OUTPATIENT_CLINIC_OR_DEPARTMENT_OTHER): Payer: Commercial Managed Care - PPO | Admitting: Hematology and Oncology

## 2022-05-27 ENCOUNTER — Encounter: Payer: Self-pay | Admitting: Hematology and Oncology

## 2022-05-27 ENCOUNTER — Inpatient Hospital Stay: Payer: Commercial Managed Care - PPO | Attending: Hematology and Oncology

## 2022-05-27 VITALS — BP 110/68 | HR 64 | Temp 97.8°F | Resp 18 | Ht 63.0 in | Wt 155.6 lb

## 2022-05-27 DIAGNOSIS — G629 Polyneuropathy, unspecified: Secondary | ICD-10-CM | POA: Diagnosis not present

## 2022-05-27 DIAGNOSIS — R918 Other nonspecific abnormal finding of lung field: Secondary | ICD-10-CM | POA: Insufficient documentation

## 2022-05-27 DIAGNOSIS — Z86711 Personal history of pulmonary embolism: Secondary | ICD-10-CM | POA: Insufficient documentation

## 2022-05-27 DIAGNOSIS — C562 Malignant neoplasm of left ovary: Secondary | ICD-10-CM

## 2022-05-27 DIAGNOSIS — R232 Flushing: Secondary | ICD-10-CM | POA: Insufficient documentation

## 2022-05-27 DIAGNOSIS — Z7901 Long term (current) use of anticoagulants: Secondary | ICD-10-CM | POA: Diagnosis not present

## 2022-05-27 DIAGNOSIS — Z79899 Other long term (current) drug therapy: Secondary | ICD-10-CM | POA: Insufficient documentation

## 2022-05-27 DIAGNOSIS — I2699 Other pulmonary embolism without acute cor pulmonale: Secondary | ICD-10-CM

## 2022-05-27 LAB — COMPREHENSIVE METABOLIC PANEL
ALT: 19 U/L (ref 0–44)
AST: 16 U/L (ref 15–41)
Albumin: 4.4 g/dL (ref 3.5–5.0)
Alkaline Phosphatase: 63 U/L (ref 38–126)
Anion gap: 5 (ref 5–15)
BUN: 15 mg/dL (ref 6–20)
CO2: 27 mmol/L (ref 22–32)
Calcium: 9 mg/dL (ref 8.9–10.3)
Chloride: 109 mmol/L (ref 98–111)
Creatinine, Ser: 0.9 mg/dL (ref 0.44–1.00)
GFR, Estimated: >60 mL/min (ref >60)
Glucose, Bld: 107 mg/dL — ABNORMAL HIGH (ref 70–99)
Potassium: 3.9 mmol/L (ref 3.5–5.1)
Sodium: 141 mmol/L (ref 135–145)
Total Bilirubin: 0.5 mg/dL (ref 0.3–1.2)
Total Protein: 6.8 g/dL (ref 6.5–8.1)

## 2022-05-27 LAB — CBC WITH DIFFERENTIAL/PLATELET
Abs Immature Granulocytes: 0 10*3/uL (ref 0.00–0.07)
Basophils Absolute: 0.1 10*3/uL (ref 0.0–0.1)
Basophils Relative: 1 %
Eosinophils Absolute: 0.2 10*3/uL (ref 0.0–0.5)
Eosinophils Relative: 3 %
HCT: 37.7 % (ref 36.0–46.0)
Hemoglobin: 12.6 g/dL (ref 12.0–15.0)
Immature Granulocytes: 0 %
Lymphocytes Relative: 35 %
Lymphs Abs: 1.8 10*3/uL (ref 0.7–4.0)
MCH: 28.5 pg (ref 26.0–34.0)
MCHC: 33.4 g/dL (ref 30.0–36.0)
MCV: 85.3 fL (ref 80.0–100.0)
Monocytes Absolute: 0.4 10*3/uL (ref 0.1–1.0)
Monocytes Relative: 8 %
Neutro Abs: 2.7 10*3/uL (ref 1.7–7.7)
Neutrophils Relative %: 53 %
Platelets: 211 10*3/uL (ref 150–400)
RBC: 4.42 MIL/uL (ref 3.87–5.11)
RDW: 13.1 % (ref 11.5–15.5)
WBC: 5.1 10*3/uL (ref 4.0–10.5)
nRBC: 0 % (ref 0.0–0.2)

## 2022-05-27 NOTE — Progress Notes (Signed)
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[place]) She has no signs or symptoms of recurrent disease We discussed timing of port removal; due to limitation of physical activity, I will get that removed next week She has GYN follow-up with CT imaging next month  Bilateral pulmonary embolism ([place]) She will continue treatment for minimum 6 months of anticoagulation therapy, to be completed by end of November She is doing well with no signs of bleeding  Orders Placed This Encounter  Procedures   IR REMOVAL TUN ACCESS W/ PORT W/O FL MOD SED    Standing Status:   Future    Standing Expiration Date:   05/28/2023    Order Specific Question:   Reason for exam:    Answer:   no need port, completed chemo    Order Specific Question:   Is the patient pregnant?    Answer:   No    Order Specific Question:   Preferred Imaging Location?    Answer:   [facility]    All questions were answered. The patient knows to call the clinic with any problems, questions or concerns. The total time spent in the appointment was 20 minutes encounter with patients including review of chart and various tests results, discussions about plan of care and coordination of care plan   Heath Lark, MD 05/27/2022 10:48 AM  INTERVAL HISTORY: Please see below for problem oriented charting. she returns for surveillance follow-up She is doing well No abdominal pain or changes in bowel habits She has very mild residual neuropathy but it does not bother her She has occasional changes in mood and hot flashes but she has not needed to take Effexor She appears to be coping well since her diagnosis and appears to be enjoying her life without the need to work right now  REVIEW OF SYSTEMS:   Constitutional: Denies fevers, chills or abnormal weight loss Eyes: Denies blurriness of vision Ears, nose, mouth, throat, and  face: Denies mucositis or sore throat Respiratory: Denies cough, dyspnea or wheezes Cardiovascular: Denies palpitation, chest discomfort or lower extremity swelling Gastrointestinal:  Denies nausea, heartburn or change in bowel habits Skin: Denies abnormal skin rashes Lymphatics: Denies new lymphadenopathy or easy bruising Behavioral/Psych: Mood is stable, no new changes  All other systems were reviewed with the patient and are negative.  I have reviewed the past medical history, past surgical history, social history and family history with the patient and they are unchanged from previous note.  ALLERGIES:  is allergic to heparin, beef-derived products, and pork-derived products.  MEDICATIONS:  Current Outpatient Medications  Medication Sig Dispense Refill   acetaminophen (TYLENOL) 500 MG tablet Take 1,000 mg by mouth every 6 (six) hours as needed for mild pain.     apixaban (ELIQUIS) 5 MG TABS tablet Take 1 tablet (5 mg total) by mouth 2 (two) times daily for 60 doses. 60 tablet 1   EPINEPHrine 0.3 mg/0.3 mL IJ SOAJ injection Inject into the muscle.     estradiol (ESTRACE VAGINAL) 0.1 MG/GM vaginal cream Place 1 Applicatorful vaginally at bedtime. (Patient not taking: Reported on 04/09/2022) 42.5 g 3   famotidine (PEPCID) 20 MG tablet Take 1 tablet (20 mg total) by mouth at bedtime. (Patient taking differently: Take 20 mg by mouth daily as needed for heartburn.)     traZODone (DESYREL) 50 MG tablet Take 25 mg by mouth at  bedtime as needed for sleep.     valACYclovir (VALTREX) 500 MG tablet Take 2,000 mg by mouth See admin instructions. Loading dose of 2,000 mg BID for 1 day PRN cold sore.     venlafaxine XR (EFFEXOR-XR) 37.5 MG 24 hr capsule TAKE 1 CAPSULE BY MOUTH DAILY WITH BREAKFAST. 90 capsule 3   No current facility-administered medications for this visit.    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 She has never undergone biopsy of the retroperitoneal tissue and its been historically activated to her multiple abdominal surgeries. MRI scan on 06/2020 showed fatty infiltration of the retroperitoneal space surrounding the aorta and IVC to the bifurcation with subcentimeter retroperitoneal nodes that appeared stable to marginally improved from prior CT, consistent with retroperitoneal fibrosis. At her most recent visit in November, she reported that she was overall doing well, continues to have chronic intermittent right upper quadrant abdominal pain. She had a repeat CT scan in November 2022 which showed an abnormal appearance of the left ovary with somewhat masslike appearance and ill-defined margins. She then had a follow-up ultrasound in November 2022 that showed a moderately lobulated mass within the left adnexa that was solid with mild peripheral and central vascularity, measuring about 4 cm, O-RADS 4, no findings of ascites. She has a history of endometriosis (s/p [facility]SC ovarian cystectomies for several endometriomas and is s/p [facility] hysterectomy).   07/09/2021 Imaging   Ct abdomen and pelvis Abnormal appearance of the left ovary with somewhat mass like appearance and ill defined margins, possibly neoplasm.  Recommend pelvic ultrasound for further evaluation.   Infiltrative changes of the retroperitoneum, similar compared with previous examination, consistent with known retroperitoneal fibrosis     09/19/2021 Pathology Results   A. Ovary and fallopian tube, left, salpingo-oophorectomy - Endometrioid adenocarcinoma, seromucinous type, FIGO grade 1, present as multiple foci of invasive carcinoma arising in a background of serousmucinous borderline tumor, see comment and synoptic report -Ovarian surface involvement is identified -Left fallopian tube with  focal endometriosis and focal foreign body giant cell reaction consistent with prior surgical intervention, no involvement by tumor identified   B.  Ovary and Fallopian tube, right, salpingo-oophorectomy -Serous cystadenoma, 1.7 cm, right ovary  -Background ovary with focal endometriosis and surface adhesions -Right fallopian tube with serosal adhesions -No involvement by tumor is identified   C.  Omental biopsy -Benign adipose tissue, no involvement by tumor identified   09/19/2021 Surgery   Procedures: Robotic assisted bilateral salpingo-oophorectomy (CPT [phone]), omental biopsy, bilateral ureterolysis (CPT 24097), lysis of adhesions  Surgeon: Cindie Laroche, MD  Assistants: Bernadene Bell, MD - Fellow, Deeann Cree, MD - Resident Findings: On entry to abdomen, dense adhesions of the omentum to the anterior abdominal wall, unable to visualize upper abdomen, specifically unable to visualize the diaphragm, liver, stomach. Omentum grossly normal apart from adhesions. Entry site inspected with no trauma to surrounding structures. Bilateral complex ovarian masses, left approximately 4cm and right 3cm. Bilateral ovarian masses densely adherent to the pelvic sidewalls, and the right mass adherent to the mesentery of the rectum. Uterus and cervix surgically absent. Retroperitoneal fibrosis bilaterally requiring ureterolysis from the pelvic brim to the insertion into the bladder bilaterally. IOFS c/w borderline tumor.   Specimens:  ID Type Source Tests Collected by Time Destination  1 : left tube and  ovary Tissue Ovary, Left SURGICAL PATHOLOGY EXAM Bernadene Bell, MD 09/19/2021 [phone]  2 : pelvic washing Washing Pelvic cavity CYTOLOGY - EXAM FROM OPTIME Christella Hartigan, MD 09/19/2021 [phone]  3 : right ovary and tube Tissue Ovary, Right SURGICAL PATHOLOGY EXAM Christella Hartigan, MD 09/19/2021 1012  4 : omental biopsy Tissue Omentum SURGICAL PATHOLOGY EXAM Christella Hartigan, MD 09/19/2021 1012     10/29/2021 Procedure   Successful placement of a single lumen Power port catheter in the right internal jugular vein under ultrasound and fluoroscopy at [facility]   10/30/2021 -  Chemotherapy   She received first cycle of carboplatin and taxol at [facility]   11/04/2021 Initial Diagnosis   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 Right-sided venous Port-A-Cath positioning, as described above. 2. No acute or active cardiopulmonary disease.   11/19/2021 - 12/31/2021 Chemotherapy   Patient is on Treatment Plan : OVARIAN Carboplatin (AUC 6) / Paclitaxel (175) q21d x 4 cycles (including 1 cycle from [facility])     01/02/2022 Genetic Testing   Negative genetic testing on the cancerNext-Expanded+RNAinsight panel.  The report date is Jan 02, 2022.  The CancerNext-Expanded gene panel offered by [facility] and includes sequencing and rearrangement analysis for the following 77 genes: AIP, ALK, APC*, ATM*, AXIN2, BAP1, BARD1, BLM, BMPR1A, BRCA1*, BRCA2*, BRIP1*, CDC73, CDH1*, CDK4, CDKN1B, CDKN2A, CHEK2*, CTNNA1, DICER1, FANCC, FH, FLCN, GALNT12, KIF1B, LZTR1, MAX, MEN1, MET, MLH1*, MSH2*, MSH3, MSH6*, MUTYH*, NBN, NF1*, NF2, NTHL1, PALB2*, PHOX2B, PMS2*, POT1, PRKAR1A, PTCH1, PTEN*, RAD51C*, RAD51D*, RB1, RECQL, RET, SDHA, SDHAF2, SDHB, SDHC, SDHD, SMAD4, SMARCA4, SMARCB1, SMARCE1, STK11, SUFU, TMEM127, TP53*, TSC1, TSC2, VHL and XRCC2 (sequencing and deletion/duplication); EGFR, EGLN1, HOXB13, KIT, MITF, PDGFRA, POLD1, and POLE (sequencing only); EPCAM and GREM1 (deletion/duplication only). DNA and RNA analyses performed for * genes.    01/07/2022 Imaging   1. Acute pulmonary embolus in the right middle lobe pulmonary  artery and segmental branches to the right and left lower lobes. No evidence for right heart strain. RV/LV ratio is 0.78. 2. Atelectasis in the lung bases. Component of pulmonary infarct in the right lower lobe not excluded. 3. Small bilateral pulmonary nodules measuring up to 8 mm.   02/21/2022 Imaging   1. Small LEFT LEFT lower lobe pulmonary nodule approximally 8 mm greatest axial dimension is not changed in the very short interval but warrants attention on subsequent imaging. Comparison with more remote prior exams may be helpful to determine whether this is a finding of significance. Consider short interval follow-up if this is not documented on more remote imaging to ensure stability. 2. Area in the porta hepatis reflects biliary to enteric anastomosis following biliary reconstruction with small bowel anastomosis in the upper abdomen. 3. Stranding and or tiny areas of nodal tissue around the aorta. This is nonspecific but can be seen in the setting of early retroperitoneal fibrosis, apparently this patient has been followed for this finding since 2013. No remote priors are available to allow for comparison at this time. 4. No definitive signs of metastatic or recurrent disease.     02/21/2022 Tumor Marker   Patient's tumor was tested for the following markers: CA-125. Results of the tumor marker test revealed  23.9.     PHYSICAL EXAMINATION: ECOG PERFORMANCE STATUS: 1 - Symptomatic but completely ambulatory  Vitals:   05/27/22 1004  BP: 110/68  Pulse: 64  Resp: 18  Temp: 97.8 F (36.6 C)  SpO2: 100%   Filed Weights   05/27/22 1004  Weight: 155 lb 9.6 oz (70.6 kg)    GENERAL:alert, no distress and comfortable NEURO: alert & oriented x 3 with fluent speech, no focal motor/sensory deficits  LABORATORY DATA:  I have reviewed the data as listed    Component Value Date/Time   NA 141 05/27/2022 0952   K 3.9 05/27/2022 0952   CL 109 05/27/2022 0952   CO2 27 05/27/2022 0952    GLUCOSE 107 (H) 05/27/2022 0952   BUN 15 05/27/2022 0952   CREATININE 0.90 05/27/2022 0952   CREATININE 0.84 01/21/2022 0923   CALCIUM 9.0 05/27/2022 0952   PROT 6.8 05/27/2022 0952   ALBUMIN 4.4 05/27/2022 0952   AST 16 05/27/2022 0952   AST 14 (L) 01/21/2022 0923   ALT 19 05/27/2022 0952   ALT 18 01/21/2022 0923   ALKPHOS 63 05/27/2022 0952   BILITOT 0.5 05/27/2022 0952   BILITOT 0.4 01/21/2022 0923   GFRNONAA >60 05/27/2022 0952   GFRNONAA >60 01/21/2022 0923    No results found for: "SPEP", "UPEP"  Lab Results  Component Value Date   WBC 5.1 05/27/2022   NEUTROABS 2.7 05/27/2022   HGB 12.6 05/27/2022   HCT 37.7 05/27/2022   MCV 85.3 05/27/2022   PLT 211 05/27/2022      Chemistry      Component Value Date/Time   NA 141 05/27/2022 0952   K 3.9 05/27/2022 0952   CL 109 05/27/2022 0952   CO2 27 05/27/2022 0952   BUN 15 05/27/2022 0952   CREATININE 0.90 05/27/2022 0952   CREATININE 0.84 01/21/2022 0923      Component Value Date/Time   CALCIUM 9.0 05/27/2022 0952   ALKPHOS 63 05/27/2022 0952   AST 16 05/27/2022 0952   AST 14 (L) 01/21/2022 0923   ALT 19 05/27/2022 0952   ALT 18 01/21/2022 0923   BILITOT 0.5 05/27/2022 0952   BILITOT 0.4 01/21/2022 8466

## 2022-05-27 NOTE — Assessment & Plan Note (Signed)
She will continue treatment for minimum 6 months of anticoagulation therapy, to be completed by end of November She is doing well with no signs of bleeding

## 2022-05-27 NOTE — Assessment & Plan Note (Addendum)
She has no signs or symptoms of recurrent disease We discussed timing of port removal; due to limitation of physical activity, I will get that removed next week She has GYN follow-up with CT imaging next month

## 2022-05-30 ENCOUNTER — Telehealth: Payer: Self-pay

## 2022-05-30 LAB — CA 125: Cancer Antigen (CA) 125: 13.6 U/mL (ref 0.0–38.1)

## 2022-05-30 NOTE — Telephone Encounter (Signed)
Called and left below message. Ask her to call the office for questions. ?

## 2022-05-30 NOTE — Telephone Encounter (Signed)
-----   Message from Heath Lark, MD sent at 05/30/2022  8:37 AM EDT ----- Pls let her know CA-125 is normal

## 2022-06-05 ENCOUNTER — Other Ambulatory Visit (HOSPITAL_COMMUNITY): Payer: Commercial Managed Care - PPO

## 2022-06-10 ENCOUNTER — Ambulatory Visit (HOSPITAL_COMMUNITY)
Admission: RE | Admit: 2022-06-10 | Discharge: 2022-06-10 | Disposition: A | Discharge status: Home / Self Care | Payer: Commercial Managed Care - PPO | Source: Ambulatory Visit | Attending: Hematology and Oncology | Admitting: Hematology and Oncology

## 2022-06-10 DIAGNOSIS — I2699 Other pulmonary embolism without acute cor pulmonale: Secondary | ICD-10-CM | POA: Insufficient documentation

## 2022-06-10 DIAGNOSIS — Z452 Encounter for adjustment and management of vascular access device: Secondary | ICD-10-CM | POA: Insufficient documentation

## 2022-06-10 DIAGNOSIS — C562 Malignant neoplasm of left ovary: Secondary | ICD-10-CM | POA: Diagnosis not present

## 2022-06-10 HISTORY — PX: IR REMOVAL TUN ACCESS W/ PORT W/O FL MOD SED: IMG2290

## 2022-06-10 MED ORDER — LIDOCAINE-EPINEPHRINE 1 %-1:100000 IJ SOLN
INTRAMUSCULAR | Status: AC
  Filled 2022-06-10: qty 1

## 2022-06-10 NOTE — Procedures (Addendum)
Interventional Radiology Procedure Note  Procedure: Right chest port removal  Complications: None  Estimated Blood Loss: < 10 mL  Findings: Right chest port removed utilizing sharp and blunt dissection. Wound closed with absorbable sutures and Dermabond.  Venetia Night. Kathlene Cote, M.D Pager:  [phone]

## 2022-06-16 ENCOUNTER — Telehealth: Payer: Self-pay

## 2022-06-16 ENCOUNTER — Encounter: Payer: Self-pay | Admitting: Hematology and Oncology

## 2022-06-16 ENCOUNTER — Other Ambulatory Visit: Payer: Self-pay

## 2022-06-16 MED ORDER — VALACYCLOVIR HCL 1 G PO TABS: 1000.0000 mg | ORAL_TABLET | Freq: Two times a day (BID) | ORAL | 1 refills | Status: AC

## 2022-06-16 NOTE — Telephone Encounter (Signed)
Called and left below message. Ask to her to call the office in the future with symptoms and medication requests. Rx sent to her pharmacy.

## 2022-06-16 NOTE — Telephone Encounter (Signed)
-----   Message from Heath Lark, MD sent at 06/16/2022  1:03 PM EDT ----- Pls remind her to always call Can you e-scribe 1 g valtrex BID for 10 days? 1 refill only

## 2022-06-30 ENCOUNTER — Ambulatory Visit (HOSPITAL_COMMUNITY)
Admission: RE | Admit: 2022-06-30 | Discharge: 2022-06-30 | Disposition: A | Discharge status: Home / Self Care | Payer: Commercial Managed Care - PPO | Source: Ambulatory Visit | Attending: Gynecologic Oncology | Admitting: Gynecologic Oncology

## 2022-06-30 ENCOUNTER — Encounter: Payer: Self-pay | Admitting: Hematology and Oncology

## 2022-06-30 DIAGNOSIS — C562 Malignant neoplasm of left ovary: Secondary | ICD-10-CM | POA: Insufficient documentation

## 2022-06-30 DIAGNOSIS — R918 Other nonspecific abnormal finding of lung field: Secondary | ICD-10-CM | POA: Insufficient documentation

## 2022-06-30 MED ORDER — SODIUM CHLORIDE (PF) 0.9 % IJ SOLN
INTRAMUSCULAR | Status: AC
  Filled 2022-06-30: qty 50

## 2022-06-30 MED ORDER — IOHEXOL 300 MG/ML  SOLN
100.0000 mL | Freq: Once | INTRAMUSCULAR | Status: AC | PRN
  Administered 2022-06-30: 100 mL via INTRAVENOUS

## 2022-07-01 ENCOUNTER — Encounter: Payer: Self-pay | Admitting: Gynecologic Oncology

## 2022-07-15 ENCOUNTER — Telehealth: Payer: Self-pay | Admitting: *Deleted

## 2022-07-15 NOTE — Telephone Encounter (Signed)
Patient scheduled for a follow up on 2/2

## 2022-07-21 ENCOUNTER — Encounter: Payer: Self-pay | Admitting: Hematology and Oncology

## 2022-07-22 ENCOUNTER — Other Ambulatory Visit: Payer: Self-pay | Admitting: Hematology and Oncology

## 2022-07-22 ENCOUNTER — Encounter: Payer: Self-pay | Admitting: Hematology and Oncology

## 2022-07-22 MED ORDER — TRAZODONE HCL 100 MG PO TABS: 100.0000 mg | ORAL_TABLET | Freq: Every evening | ORAL | 1 refills | Status: DC | PRN

## 2022-07-23 ENCOUNTER — Other Ambulatory Visit: Payer: Self-pay | Admitting: Hematology and Oncology

## 2022-07-24 ENCOUNTER — Telehealth: Payer: Self-pay

## 2022-07-24 ENCOUNTER — Other Ambulatory Visit: Payer: Self-pay | Admitting: Hematology and Oncology

## 2022-07-24 DIAGNOSIS — K219 Gastro-esophageal reflux disease without esophagitis: Secondary | ICD-10-CM | POA: Insufficient documentation

## 2022-07-24 MED ORDER — PANTOPRAZOLE SODIUM 40 MG PO TBEC: 40.0000 mg | DELAYED_RELEASE_TABLET | Freq: Every day | ORAL | 1 refills | Status: DC

## 2022-07-24 NOTE — Telephone Encounter (Signed)
-----   Message from Heath Lark, MD sent at 07/24/2022  8:06 AM EST ----- Pls send referral to Dr. Collene Mares for GERD

## 2022-07-24 NOTE — Telephone Encounter (Signed)
Called and advised Pt to seek a PCP within [facility] for a GI referral.   Received call from Dr. Lorie Apley office that they will not see Pt due to previous visit with Dr. Dorrene German. Pt not agreeable to seeing Dr. Dorrene German again, discussed options with Dr. Alvy Bimler.

## 2022-07-24 NOTE — Telephone Encounter (Signed)
Faxed referral to Dr. Lorie Apley office- [phone], received fax confirmation.

## 2022-08-13 ENCOUNTER — Other Ambulatory Visit: Payer: Self-pay | Admitting: Hematology and Oncology

## 2022-09-18 ENCOUNTER — Encounter: Payer: Self-pay | Admitting: Gynecologic Oncology

## 2022-09-18 ENCOUNTER — Telehealth: Payer: Self-pay

## 2022-09-18 NOTE — Telephone Encounter (Signed)
Pt called this morning stating a need for rescheduling her appointment with Dr. Berline Lopes for tomorrow 2/2. Pt has been rescheduled for 2/9 '@4'$ :00

## 2022-09-19 ENCOUNTER — Inpatient Hospital Stay: Payer: Commercial Managed Care - PPO | Admitting: Gynecologic Oncology

## 2022-09-22 ENCOUNTER — Encounter: Payer: Self-pay | Admitting: Hematology and Oncology

## 2022-09-26 ENCOUNTER — Encounter: Payer: Self-pay | Admitting: Gynecologic Oncology

## 2022-09-26 ENCOUNTER — Inpatient Hospital Stay: Payer: Commercial Managed Care - PPO

## 2022-09-26 ENCOUNTER — Inpatient Hospital Stay: Payer: Commercial Managed Care - PPO | Attending: Gynecologic Oncology | Admitting: Gynecologic Oncology

## 2022-09-26 VITALS — BP 106/83 | HR 73 | Temp 97.6°F | Resp 16 | Ht 63.0 in | Wt 159.8 lb

## 2022-09-26 DIAGNOSIS — Z90722 Acquired absence of ovaries, bilateral: Secondary | ICD-10-CM | POA: Diagnosis not present

## 2022-09-26 DIAGNOSIS — Z90710 Acquired absence of both cervix and uterus: Secondary | ICD-10-CM | POA: Diagnosis not present

## 2022-09-26 DIAGNOSIS — Z9221 Personal history of antineoplastic chemotherapy: Secondary | ICD-10-CM | POA: Insufficient documentation

## 2022-09-26 DIAGNOSIS — Z8543 Personal history of malignant neoplasm of ovary: Secondary | ICD-10-CM | POA: Diagnosis not present

## 2022-09-26 DIAGNOSIS — F419 Anxiety disorder, unspecified: Secondary | ICD-10-CM

## 2022-09-26 DIAGNOSIS — C562 Malignant neoplasm of left ovary: Secondary | ICD-10-CM

## 2022-09-26 DIAGNOSIS — F418 Other specified anxiety disorders: Secondary | ICD-10-CM

## 2022-09-26 DIAGNOSIS — Z1211 Encounter for screening for malignant neoplasm of colon: Secondary | ICD-10-CM

## 2022-09-26 NOTE — Progress Notes (Signed)
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 She has never undergone biopsy of the retroperitoneal tissue and its been historically activated to her multiple abdominal surgeries. MRI scan on 06/2020 showed fatty infiltration of the retroperitoneal space surrounding the aorta and IVC to the bifurcation with subcentimeter retroperitoneal nodes that appeared stable to marginally improved from prior CT, consistent with retroperitoneal fibrosis. At her most recent visit in November, she reported that she was overall doing well, continues to have chronic intermittent right upper quadrant abdominal pain. She had a repeat CT scan in November 2022 which showed an abnormal appearance of the left ovary with somewhat masslike appearance and ill-defined margins. She then had a follow-up ultrasound in November 2022 that showed a moderately lobulated mass within the left adnexa that was solid with mild peripheral and central vascularity, measuring about 4 cm, O-RADS 4, no findings of ascites. She has a history of endometriosis (s/p [facility] ovarian cystectomies for several endometriomas and is s/p [facility] hysterectomy).   07/09/2021 Imaging   Ct abdomen and pelvis Abnormal appearance of the left ovary with somewhat mass like appearance and ill defined margins, possibly neoplasm.  Recommend pelvic ultrasound for further evaluation.   Infiltrative changes of the retroperitoneum, similar compared with previous examination, consistent with known retroperitoneal fibrosis     09/19/2021 Pathology Results   A. Ovary and fallopian tube, left, salpingo-oophorectomy - Endometrioid adenocarcinoma, seromucinous  type, FIGO grade 1, present as multiple foci of invasive carcinoma arising in a background of serousmucinous borderline tumor, see comment and synoptic report -Ovarian surface involvement is identified -Left fallopian tube with focal endometriosis and focal foreign body giant cell reaction consistent with prior surgical intervention, no involvement by tumor identified   B.  Ovary and Fallopian tube, right, salpingo-oophorectomy -Serous cystadenoma, 1.7 cm, right ovary  -Background ovary with focal endometriosis and surface adhesions -Right fallopian tube with serosal adhesions -No involvement by tumor is identified   C.  Omental biopsy -Benign adipose tissue, no involvement by tumor identified   09/19/2021 Surgery   Procedures: Robotic assisted bilateral salpingo-oophorectomy (CPT [phone]), omental biopsy, bilateral ureterolysis (CPT [id number]), lysis of adhesions  Surgeon: Cindie Laroche, MD  Assistants: Bernadene Bell, MD - Fellow, Deeann Cree, MD - Resident Findings: On entry to abdomen, dense adhesions of the omentum to the anterior abdominal wall, unable to visualize upper abdomen, specifically unable to visualize the diaphragm, liver, stomach. Omentum grossly normal apart from adhesions. Entry site inspected with no trauma to surrounding structures. Bilateral complex ovarian masses, left approximately 4cm and right 3cm. Bilateral ovarian masses densely adherent to the pelvic sidewalls, and the right mass adherent to the mesentery of the rectum. Uterus and cervix surgically absent. Retroperitoneal fibrosis bilaterally requiring ureterolysis from the pelvic brim to the insertion into the bladder bilaterally. IOFS c/w borderline tumor.   Specimens:  ID Type Source Tests Collected by Time Destination  1 : left tube and ovary Tissue Ovary, Left SURGICAL PATHOLOGY EXAM Bernadene Bell, MD 09/19/2021 [phone]  2 : pelvic washing Washing Pelvic cavity CYTOLOGY - EXAM FROM OPTIME Christella Hartigan, MD  09/19/2021 [phone]  3 : right ovary and tube Tissue Ovary, Right SURGICAL PATHOLOGY EXAM Christella Hartigan, MD 09/19/2021 1012  4 :  omental biopsy Tissue Omentum SURGICAL PATHOLOGY EXAM Christella Hartigan, MD 09/19/2021 1012    10/29/2021 Procedure   Successful placement of a single lumen Power port catheter in the right internal jugular vein under ultrasound and fluoroscopy at [facility]   10/30/2021 -  Chemotherapy   She received first cycle of carboplatin and taxol at [facility]   11/04/2021 Initial Diagnosis   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 Right-sided venous Port-A-Cath positioning, as described above. 2. No acute or active cardiopulmonary disease.   11/19/2021 - 12/31/2021 Chemotherapy   Patient is on Treatment Plan : OVARIAN Carboplatin (AUC 6) / Paclitaxel (175) q21d x 4 cycles (including 1 cycle from [facility])     01/02/2022 Genetic Testing   Negative genetic testing on the cancerNext-Expanded+RNAinsight panel.  The report date is Jan 02, 2022.  The CancerNext-Expanded gene panel offered by [name] and includes sequencing and rearrangement analysis for the following 77 genes: AIP, ALK, APC*, ATM*, AXIN2, BAP1, BARD1, BLM, BMPR1A, BRCA1*, BRCA2*, BRIP1*, CDC73, CDH1*, CDK4, CDKN1B, CDKN2A, CHEK2*, CTNNA1, DICER1, FANCC, FH, FLCN, GALNT12, KIF1B, LZTR1, MAX, MEN1, MET, MLH1*, MSH2*, MSH3, MSH6*, MUTYH*, NBN, NF1*, NF2, NTHL1, PALB2*, PHOX2B, PMS2*, POT1, PRKAR1A, PTCH1, PTEN*, RAD51C*, RAD51D*, RB1, RECQL, RET, SDHA, SDHAF2, SDHB, SDHC, SDHD, SMAD4, SMARCA4, SMARCB1, SMARCE1, STK11, SUFU, TMEM127, TP53*, TSC1, TSC2, VHL and XRCC2 (sequencing and deletion/duplication); EGFR, EGLN1, HOXB13, KIT,  MITF, PDGFRA, POLD1, and POLE (sequencing only); EPCAM and GREM1 (deletion/duplication only). DNA and RNA analyses performed for * genes.    01/07/2022 Imaging   1. Acute pulmonary embolus in the right middle lobe pulmonary artery and segmental branches to the right and left lower lobes. No evidence for right heart strain. RV/LV ratio is 0.78. 2. Atelectasis in the lung bases. Component of pulmonary infarct in the right lower lobe not excluded. 3. Small bilateral pulmonary nodules measuring up to 8 mm.   02/21/2022 Imaging   1. Small LEFT LEFT lower lobe pulmonary nodule approximally 8 mm greatest axial dimension is not changed in the very short interval but warrants attention on subsequent imaging. Comparison with more remote prior exams may be helpful to determine whether this is a finding of significance. Consider short interval follow-up if this is not documented on more remote imaging to ensure stability. 2. Area in the porta hepatis reflects biliary to enteric anastomosis following biliary reconstruction with small bowel anastomosis in the upper abdomen. 3. Stranding and or tiny areas of nodal tissue around the aorta. This is nonspecific but can be seen in the setting of early retroperitoneal fibrosis, apparently this patient has been followed for this finding since 2013. No remote priors are available to allow for comparison at this time. 4. No definitive signs of metastatic or recurrent disease.     02/21/2022 Tumor Marker   Patient's tumor was tested for the following markers: CA-125. Results of the tumor marker test revealed 23.9.   05/30/2022 Tumor Marker   Patient's tumor was tested for the following markers: CA-125. Results of the tumor marker test revealed 13.6.   06/10/2022 Procedure   Removal of implanted Port-A-Cath utilizing sharp and blunt dissection. The procedure was uncomplicated.       Interval History: The patient  reports physically doing well.  She denies any pelvic  pain.  Has occasional right upper quadrant pain, at baseline, which she attributes to her GI tract.  Denies any change to bowel function.  Denies urinary symptoms.  Denies any vaginal bleeding or discharge.  She has been struggling to find the joy for life and activities that she used to enjoy.  Past Medical/Surgical History: Past Medical History:  Diagnosis Date   Asthma    Bilateral pulmonary embolism ([place]) 01/07/2022   Endometriosis    Family history of breast cancer    Family history of ovarian cancer    GERD (gastroesophageal reflux disease)    IBS (irritable bowel syndrome)    Idiopathic angioedema    Juvenile polyp of colon    Juvenile polyposis syndrome    Pulmonary nodules 01/07/2022   Retroperitoneal fibrosis     Past Surgical History:  Procedure Laterality Date   ABDOMINAL HYSTERECTOMY     APPENDECTOMY     bile duct reconstruction     CHOLECYSTECTOMY     CHOLEDOCHOJEJUNOSTOMY     IR REMOVAL TUN ACCESS W/ PORT W/O FL MOD SED  06/10/2022   pancreatic duct surgery     POLYPECTOMY     TONSILLECTOMY      Family History  Problem Relation Age of Onset   Breast cancer Mother 14   Ovarian cancer Mother 30   Cancer Father        scrotal cancer   Lung cancer Father    Breast cancer Other    Kidney cancer Other    Breast cancer Half-Sister 11       Neg GT    Social History   Socioeconomic History   Marital status: Married    Spouse name: Tana Felts   Number of children: 2   Years of education: Not on file   Highest education level: Not on file  Occupational History   Occupation: clinical nurse specialist  Tobacco Use   Smoking status: Never   Smokeless tobacco: Never  Substance and Sexual Activity   Alcohol use: Not Currently   Drug use: Never   Sexual activity: Yes  Other Topics Concern   Not on file  Social History Narrative   Not on file   Social Determinants of Health   Financial Resource Strain: Not on file  Food Insecurity: Not on file   Transportation Needs: Not on file  Physical Activity: Not on file  Stress: Not on file  Social Connections: Not on file    Current Medications:  Current Outpatient Medications:    acetaminophen (TYLENOL) 500 MG tablet, Take 1,000 mg by mouth every 6 (six) hours as needed for mild pain., Disp: , Rfl:    EPINEPHrine 0.3 mg/0.3 mL IJ SOAJ injection, Inject into the muscle., Disp: , Rfl:    famotidine (PEPCID) 20 MG tablet, Take 1 tablet (20 mg total) by mouth at bedtime. (Patient taking differently: Take 20 mg by mouth daily as needed for heartburn.), Disp: , Rfl:    pantoprazole (PROTONIX) 40 MG tablet, Take 1 tablet (40 mg total) by mouth daily., Disp: 90 tablet, Rfl: 1   traZODone (DESYREL) 100 MG tablet, TAKE 1 TABLET BY MOUTH AT BEDTIME AS NEEDED FOR SLEEP., Disp: 90 tablet, Rfl: 1   valACYclovir (VALTREX) 500 MG tablet, Take 2,000 mg by mouth See admin instructions. Loading dose of 2,000 mg BID for 1 day PRN cold sore., Disp: , Rfl:   Review of Systems: Denies appetite changes, fevers,  chills, fatigue, unexplained weight changes. Denies hearing loss, neck lumps or masses, mouth sores, ringing in ears or voice changes. Denies cough or wheezing.  Denies shortness of breath. Denies chest pain or palpitations. Denies leg swelling. Denies abdominal distention, pain, blood in stools, constipation, diarrhea, nausea, vomiting, or early satiety. Denies pain with intercourse, dysuria, frequency, hematuria or incontinence. Denies hot flashes, pelvic pain, vaginal bleeding or vaginal discharge.   Denies joint pain, back pain or muscle pain/cramps. Denies itching, rash, or wounds. Denies dizziness, headaches, numbness or seizures. Denies swollen lymph nodes or glands, denies easy bruising or bleeding. Denies anxiety, depression, confusion, or decreased concentration.  Physical Exam: BP 106/83 (BP Location: Left Arm, Patient Position: Sitting)   Pulse 73   Temp 97.6 F (36.4 C) (Oral)    Resp 16   Ht 5' 3"$  (1.6 m)   Wt 159 lb 12.8 oz (72.5 kg)   SpO2 99%   BMI 28.31 kg/m  General: Alert, oriented, no acute distress. HEENT: Normocephalic, atraumatic, sclera anicteric. Chest: Clear to auscultation bilaterally.  No wheezes or rhonchi. Cardiovascular: Regular rate and rhythm, no murmurs. Abdomen: soft, nontender.  Normoactive bowel sounds.  No masses or hepatosplenomegaly appreciated.  Well-healed scar. Extremities: Grossly normal range of motion.  Warm, well perfused.  Trace edema of right lower extremity. Skin: No rashes or lesions noted. Lymphatics: No cervical, supraclavicular, or inguinal adenopathy. GU: Normal appearing external genitalia without erythema, excoriation, or lesions.  Speculum exam reveals mildly atrophic vaginal mucosa, no lesions or masses noted.  Bimanual exam reveals no masses or nodularity.  Rectovaginal exam confirms these findings.  Laboratory & Radiologic Studies: CT C/A/P on 06/30/22: IMPRESSION: 1. Stable 7 mm probable intrapulmonary lymph node adjacent to the left major fissure. 2. No evidence of recurrent or metastatic disease. 3. Stable postoperative changes as above.  Assessment & Plan: Kristine White is a 57 y.o. 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 Genetic testing negative.  Patient is overall doing well and is NED on exam today.  Spent some time with her today discussing the emotions that she has been feeling.  I tried to help normalize some of the thoughts/feelings that she has been struggling with in the setting of recent cancer diagnosis and treatment.  I offered referral to our social worker for counseling and consideration of support groups.    Patient thinks that she is overdue for colonoscopy.  She is more recently been on a every 5-year plan given history of colon polyps.  Referral placed today.    CA-125  was normal prior to surgery.   Per NCCN surveillance recommendations, discussed recommendation for visits every 3 months for the first 2 years and then consideration of transitioning to visits every 4-6 months.  We reviewed signs and symptoms that would be concerning for cancer recurrence and I stressed the importance of calling if she develops any of these before her next scheduled visit.   28 minutes of total time was spent for this patient encounter, including preparation, face-to-face counseling with the patient and coordination of care, and documentation of the encounter.  Jeral Pinch, MD  Division of Gynecologic Oncology  Department of Obstetrics and Gynecology  [facility]

## 2022-09-26 NOTE — Patient Instructions (Signed)
It was good to see you today.  I do not see or feel any evidence of cancer recurrence on your exam.  I will see you for follow-up in 4 months. Your CA-125 from today will release to you tomorrow.  As always, if you develop any new and concerning symptoms before your next visit, please call to see me sooner.

## 2022-09-29 ENCOUNTER — Encounter: Payer: Self-pay | Admitting: Gynecologic Oncology

## 2022-09-30 ENCOUNTER — Telehealth: Payer: Self-pay | Admitting: Licensed Clinical Social Worker

## 2022-09-30 NOTE — Telephone Encounter (Signed)
[place] Work  Clinical Social Work was referred by medical provider for assessment of psychosocial needs.  Clinical Social Worker contacted patient by phone  to offer support and assess for needs.   Pt reports difficulty adjusting post-cancer treatment. She is particularly struggling with not feeling like herself (lacking physical strength, not as passionate or active, etc). CSW normalized difficulties adjusting after treatment and discussed options for support, including support group, FYNN, and counseling. Pt scheduled for counseling session on 10/06/2022     Cedrick Partain E Floydene Flock, [place] Worker [facility]

## 2022-10-01 ENCOUNTER — Encounter: Payer: Self-pay | Admitting: Gynecologic Oncology

## 2022-10-02 LAB — CA 125: Cancer Antigen (CA) 125: 14.9 U/mL (ref 0.0–38.1)

## 2022-10-06 ENCOUNTER — Inpatient Hospital Stay: Payer: Commercial Managed Care - PPO | Admitting: Licensed Clinical Social Worker

## 2022-10-06 ENCOUNTER — Other Ambulatory Visit: Payer: Self-pay

## 2022-10-06 NOTE — Progress Notes (Signed)
[place] CSW Counseling Note  Patient was referred by medical provider. Treatment type: Individual  Presenting Concerns: Patient and/or family reports the following symptoms/concerns: stress Duration of problem: ~4 months; Severity of problem: moderate   Orientation:oriented to person, place, time/date, and situation.   Affect: Appropriate and Congruent Risk of harm to self or others: No plan to harm self or others  Patient and/or Family's Strengths/Protective Factors: Social connections and Concrete supports in place (healthy food, safe environments, etc.)Capable of independent living  Hydrographic surveyor for treatment/growth  Special hobby/interest  Supportive family/friends      Goals Addressed: Patient will:  Reduce symptoms of: anxiety and stress Increase knowledge and/or ability of: coping skills  Increase healthy adjustment to current life circumstances   Progress towards Goals: Initial   Interventions: Interventions utilized:  CBT, Solution Focused, and Supportive      Assessment: Patient currently experiencing stress and frustration in survivorship. In particular, it has been difficult adjusting to physical changes and not feeling the same passion and joy that she previously enjoyed. 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  CSW provided supportive counseling and psychoeducation on typical recovery times (mentally/emotionally) from cancer and normalized feelings. Discussed ways to begin to reframe or redirect from unhelpful thoughts.    Pt has been working on Programmer, multimedia in activities that she is interested in IT consultant, creating a studio, ice skating) and would like to be able to return to more active pursuits such as sailing, horseback riding, and  more cardio.      Plan: Follow up with CSW: 1 week virtual visit Behavioral recommendations: try Tai Chi or Chi gong. Notice what your body can do, not just what it can't. Think about what it will look like when you meet your goal for how you want to feel physically & emotionally Referral(s): [place] classes. Discussed FYNN       Clara Herbison E Tobias Avitabile, LCSW

## 2022-10-13 ENCOUNTER — Inpatient Hospital Stay: Payer: Commercial Managed Care - PPO | Admitting: Licensed Clinical Social Worker

## 2022-10-13 NOTE — Progress Notes (Signed)
[place] CSW Counseling Note  Patient was referred by medical provider. Treatment type: Individual  Presenting Concerns: Patient and/or family reports the following symptoms/concerns: stress Duration of problem: ~4 months; Severity of problem: moderate   Orientation:oriented to person, place, time/date, and situation.   Affect: Appropriate and Congruent Risk of harm to self or others: No plan to harm self or others  Patient and/or Family's Strengths/Protective Factors: Social connections and Concrete supports in place (healthy food, safe environments, etc.)Capable of independent living  Hydrographic surveyor for treatment/growth  Special hobby/interest  Supportive family/friends      Goals Addressed: Patient will:  Reduce symptoms of: anxiety and stress Increase knowledge and/or ability of: coping skills  Increase healthy adjustment to current life circumstances   Progress towards Goals: Progressing   Interventions: Interventions utilized:  CBT, Solution Focused, and Supportive      Assessment: Patient reports improvement in mood since last visit. She tried tai chi twice and found it enjoyable and relatable to her off-ice training. She also had her lesson with her old coach which was helpful in gaining more confidence in her body. Continued discussion on patience and appreciating what she can do, especially since she is increasing physical activity, even though she becomes fatigued around 2/3pm.  Spent time continuing to process panic like moments in relation to more adventurous physical activity (horseback riding, surfing, sailing). Reframed as a choice of what she pursues/ includes back in her life in the future.     Plan: Follow up with CSW: 1 week virtual visit Behavioral recommendations: continue Tai Chi, skating, photography. Continue to notice what you can do, give yourself grace and patience, and where you have choice.  Referral(s): [place] gong  classes. Discussed FYNN       Kristine Bodner E Rondell Pardon, LCSW

## 2022-10-20 ENCOUNTER — Inpatient Hospital Stay: Payer: Commercial Managed Care - PPO | Attending: Gynecologic Oncology | Admitting: Licensed Clinical Social Worker

## 2022-10-20 NOTE — Progress Notes (Signed)
[name] CSW Counseling Note  Patient was referred by medical provider. Treatment type: Individual  Presenting Concerns: Patient and/or family reports the following symptoms/concerns: stress Duration of problem: ~4 months; Severity of problem: moderate   Orientation:oriented to person, place, time/date, and situation.   Affect: Appropriate and Congruent Risk of harm to self or others: No plan to harm self or others  Patient and/or Family's Strengths/Protective Factors: Social connections and Concrete supports in place (healthy food, safe environments, etc.)Capable of independent living  Hydrographic surveyor for treatment/growth  Special hobby/interest  Supportive family/friends      Goals Addressed: Patient will:  Reduce symptoms of: anxiety and stress Increase knowledge and/or ability of: coping skills  Increase healthy adjustment to current life circumstances   Progress towards Goals: Progressing   Interventions: Interventions utilized:  CBT, Solution Focused, and Supportive      Assessment: Patient reports decrease in panic/ fight or flight sensations since last week (only once in past week). She was also able to have 3 very active days in a row, although she did feel she had to force to push through.  Today, continued to process balancing activity and rest as well as potential return to work and feelings around doing aspects of her job again and if she is emotionally able to at this time.      Plan: Follow up with CSW: 1 week virtual visit Behavioral recommendations: continue Tai Chi, skating, photography. Practice gratitude for the progress that has been made and try not to discount your experiences. Talk with your friend and/or colleague about what you are experiencing and specific thoughts about work  Referral(s): [place] classes. Discussed FYNN       Kristine Dial E Tawfiq Favila, LCSW

## 2022-10-27 ENCOUNTER — Inpatient Hospital Stay: Payer: Commercial Managed Care - PPO | Admitting: Licensed Clinical Social Worker

## 2022-10-27 NOTE — Progress Notes (Signed)
[place] CSW Counseling Note  Patient was referred by medical provider. Treatment type: Individual  Presenting Concerns: Patient and/or family reports the following symptoms/concerns: stress Duration of problem: ~4 months; Severity of problem: moderate   Orientation:oriented to person, place, time/date, and situation.   Affect: Appropriate and Congruent Risk of harm to self or others: No plan to harm self or others  Patient and/or Family's Strengths/Protective Factors: Social connections and Concrete supports in place (healthy food, safe environments, etc.)Capable of independent living  Hydrographic surveyor for treatment/growth  Special hobby/interest  Supportive family/friends      Goals Addressed: Patient will:  Reduce symptoms of: anxiety and stress Increase knowledge and/or ability of: coping skills  Increase healthy adjustment to current life circumstances   Progress towards Goals: Progressing   Interventions: Interventions utilized:  CBT, Solution Focused, and Supportive      Assessment: Patient reports onoging improvement in fight/flight sensations. She is struggling this week with feeling significant fatigue again and like she is failing at recovering.  Pt did voice that she increased trazodone and is sleeping through the night, but feels more groggy. Discussed sleep hygiene and reducing screens before bed (pt does have long-standing history of waking around 2/3am and is not consistently able to go back to sleep).    CBT strategies employed to discuss noticing negative messaging to herself ("I'm a failure") surrounding activities or hearing about future plans she does not feel confident to do yet. Also discussed trying to focus, like she does with light for photography, on what is helpful from her partner this week.     Plan: Follow up with CSW: 1 week virtual visit Behavioral recommendations: try your plans for cardio this week and notice the impact on you  physically and mentally.  Notice when you are telling yourself negative messaging and when you are receiving helpful words and actions from your partner.  Then we will work on reframing the negative and verbalizing the positive  Referral(s): [place] classes. Discussed FYNN       Kristine Myhand E Suman Trivedi, LCSW

## 2022-11-03 ENCOUNTER — Inpatient Hospital Stay: Payer: Commercial Managed Care - PPO | Admitting: Licensed Clinical Social Worker

## 2022-11-03 ENCOUNTER — Encounter: Payer: Self-pay | Admitting: Gynecologic Oncology

## 2022-11-03 NOTE — Progress Notes (Signed)
[facility] CSW Counseling Note  Patient was referred by medical provider. Treatment type: Individual Video visit  Presenting Concerns: Patient and/or family reports the following symptoms/concerns: stress Duration of problem: ~4 months; Severity of problem: moderate   Orientation:oriented to person, place, time/date, and situation.   Affect: Appropriate and Congruent Risk of harm to self or others: No plan to harm self or others  Patient and/or Family's Strengths/Protective Factors: Social connections and Concrete supports in place (healthy food, safe environments, etc.)Capable of independent living  Hydrographic surveyor for treatment/growth  Special hobby/interest  Supportive family/friends      Goals Addressed: Patient will:  Reduce symptoms of: anxiety and stress Increase knowledge and/or ability of: coping skills  Increase healthy adjustment to current life circumstances   Progress towards Goals: Progressing   Interventions: Interventions utilized:  CBT, Solution Focused, and Supportive      Assessment: Patient has had a positive week overall. When reporting her progress, she phrased it in a more helpful/positive way in terms of what she could physically do despite being tired later.  Pt is finding benefit in reading a book from another cancer pt and knowing that others feel/felt similarly to her.  Pt continues to work towards accepting where she is currently and what would it look like if this is the new normal.  Discussed how there are differences in what that acceptance may seem like or feel like for loved ones and how to focus on managing herself and what she can control.    Plan: Follow up with CSW: 1 week virtual visit Behavioral recommendations: Continue to focus on what is good and reframe unhelpful thinking as you work towards acceptance.   Referral(s): [place] gong classes. Discussed FYNN       Darrik Richman E Maleea Camilo, LCSW

## 2022-11-05 ENCOUNTER — Encounter: Payer: Self-pay | Admitting: Gynecologic Oncology

## 2022-11-11 ENCOUNTER — Inpatient Hospital Stay: Payer: Commercial Managed Care - PPO | Admitting: Licensed Clinical Social Worker

## 2022-11-11 NOTE — Progress Notes (Signed)
[facility] CSW Counseling Note  Patient was referred by medical provider. Treatment type: Individual Video visit  Presenting Concerns: Patient and/or family reports the following symptoms/concerns: stress Duration of problem: ~4 months; Severity of problem: moderate   Orientation:oriented to person, place, time/date, and situation.   Affect: Appropriate and Congruent Risk of harm to self or others: No plan to harm self or others  Patient and/or Family's Strengths/Protective Factors: Social connections and Concrete supports in place (healthy food, safe environments, etc.)Capable of independent living  Hydrographic surveyor for treatment/growth  Special hobby/interest  Supportive family/friends      Goals Addressed: Patient will:  Reduce symptoms of: anxiety and stress Increase knowledge and/or ability of: coping skills  Increase healthy adjustment to current life circumstances   Progress towards Goals: Progressing   Interventions: Interventions utilized:  CBT, Solution Focused, and Supportive      Assessment: Patient has had continuing improvements in adjustment to current situation. While still experiencing fatigue and soreness, she is mentally and emotionally coping better. She is also engaging regularly in her photography which is helping ground her in the present moment.  Began discussion on values today and how her curiosity and drive for discovery can be met through other avenues than just physically.  Pt did cite some worries about if cancer is returning as she has upcoming appts and scans in the next few months. CSW normalized and discussed noticing and acknowledging those thoughts and continuing coping skills to be able to observe without being stuck into a negative place.    Plan: Follow up with CSW: 2 weeks Behavioral recommendations: Continue to focus on what is good and reframe unhelpful thinking as you work towards acceptance.  Continue your photography as a  grounding/mindful activity.  Look at values sheet and identify which are most important to you. Referral(s): [employer] classes. Peer mentor referral 11/11/22       Kristine White E Kristine Plante, LCSW

## 2022-11-25 ENCOUNTER — Encounter: Payer: Self-pay | Admitting: Hematology and Oncology

## 2022-11-25 ENCOUNTER — Inpatient Hospital Stay: Payer: Commercial Managed Care - PPO | Admitting: Hematology and Oncology

## 2022-11-25 ENCOUNTER — Inpatient Hospital Stay: Payer: Commercial Managed Care - PPO | Admitting: Licensed Clinical Social Worker

## 2022-11-25 ENCOUNTER — Other Ambulatory Visit: Payer: Self-pay

## 2022-11-25 ENCOUNTER — Inpatient Hospital Stay: Payer: Commercial Managed Care - PPO | Attending: Gynecologic Oncology

## 2022-11-25 VITALS — BP 110/83 | HR 83 | Temp 97.6°F | Resp 18 | Ht 63.0 in | Wt 160.2 lb

## 2022-11-25 DIAGNOSIS — R42 Dizziness and giddiness: Secondary | ICD-10-CM | POA: Insufficient documentation

## 2022-11-25 DIAGNOSIS — R918 Other nonspecific abnormal finding of lung field: Secondary | ICD-10-CM

## 2022-11-25 DIAGNOSIS — R140 Abdominal distension (gaseous): Secondary | ICD-10-CM | POA: Insufficient documentation

## 2022-11-25 DIAGNOSIS — K682 Retroperitoneal fibrosis: Secondary | ICD-10-CM | POA: Insufficient documentation

## 2022-11-25 DIAGNOSIS — Z79899 Other long term (current) drug therapy: Secondary | ICD-10-CM | POA: Insufficient documentation

## 2022-11-25 DIAGNOSIS — I2699 Other pulmonary embolism without acute cor pulmonale: Secondary | ICD-10-CM | POA: Diagnosis not present

## 2022-11-25 DIAGNOSIS — C562 Malignant neoplasm of left ovary: Secondary | ICD-10-CM

## 2022-11-25 DIAGNOSIS — Z9221 Personal history of antineoplastic chemotherapy: Secondary | ICD-10-CM | POA: Diagnosis not present

## 2022-11-25 DIAGNOSIS — Z86711 Personal history of pulmonary embolism: Secondary | ICD-10-CM | POA: Diagnosis not present

## 2022-11-25 DIAGNOSIS — Z8543 Personal history of malignant neoplasm of ovary: Secondary | ICD-10-CM | POA: Insufficient documentation

## 2022-11-25 LAB — COMPREHENSIVE METABOLIC PANEL
ALT: 14 U/L (ref 0–44)
AST: 12 U/L — ABNORMAL LOW (ref 15–41)
Albumin: 4.7 g/dL (ref 3.5–5.0)
Alkaline Phosphatase: 78 U/L (ref 38–126)
Anion gap: 7 (ref 5–15)
BUN: 16 mg/dL (ref 6–20)
CO2: 28 mmol/L (ref 22–32)
Calcium: 9.9 mg/dL (ref 8.9–10.3)
Chloride: 107 mmol/L (ref 98–111)
Creatinine, Ser: 0.94 mg/dL (ref 0.44–1.00)
GFR, Estimated: >60 mL/min (ref >60)
Glucose, Bld: 97 mg/dL (ref 70–99)
Potassium: 4.3 mmol/L (ref 3.5–5.1)
Sodium: 142 mmol/L (ref 135–145)
Total Bilirubin: 0.6 mg/dL (ref 0.3–1.2)
Total Protein: 7.4 g/dL (ref 6.5–8.1)

## 2022-11-25 LAB — CBC WITH DIFFERENTIAL/PLATELET
Abs Immature Granulocytes: 0.01 10*3/uL (ref 0.00–0.07)
Basophils Absolute: 0.1 10*3/uL (ref 0.0–0.1)
Basophils Relative: 2 %
Eosinophils Absolute: 0.3 10*3/uL (ref 0.0–0.5)
Eosinophils Relative: 5 %
HCT: 41.9 % (ref 36.0–46.0)
Hemoglobin: 13.8 g/dL (ref 12.0–15.0)
Immature Granulocytes: 0 %
Lymphocytes Relative: 29 %
Lymphs Abs: 1.8 10*3/uL (ref 0.7–4.0)
MCH: 28.6 pg (ref 26.0–34.0)
MCHC: 32.9 g/dL (ref 30.0–36.0)
MCV: 86.9 fL (ref 80.0–100.0)
Monocytes Absolute: 0.5 10*3/uL (ref 0.1–1.0)
Monocytes Relative: 7 %
Neutro Abs: 3.4 10*3/uL (ref 1.7–7.7)
Neutrophils Relative %: 57 %
Platelets: 239 10*3/uL (ref 150–400)
RBC: 4.82 MIL/uL (ref 3.87–5.11)
RDW: 13.2 % (ref 11.5–15.5)
WBC: 6.1 10*3/uL (ref 4.0–10.5)
nRBC: 0 % (ref 0.0–0.2)

## 2022-11-25 NOTE — Assessment & Plan Note (Signed)
She has mild intermittent abdominal discomfort with occasional bloating She has background history of retroperitoneal fibrosis I recommend CT imaging to be done before her appointment to see GYN surgeon in June Continue supportive care

## 2022-11-25 NOTE — Progress Notes (Signed)
[facility] CSW Counseling Note  Patient was referred by medical provider. Treatment type: Individual   Presenting Concerns: Patient and/or family reports the following symptoms/concerns: stress Duration of problem: ~4 months; Severity of problem: moderate   Orientation:oriented to person, place, time/date, and situation.   Affect: Appropriate and Congruent Risk of harm to self or others: No plan to harm self or others  Patient and/or Family's Strengths/Protective Factors: Social connections and Concrete supports in place (healthy food, safe environments, etc.)Capable of independent living  Printmaker for treatment/growth  Special hobby/interest  Supportive family/friends      Goals Addressed: Patient will:  Reduce symptoms of: anxiety and stress Increase knowledge and/or ability of: coping skills  Increase healthy adjustment to current life circumstances   Progress towards Goals: Progressing   Interventions: Interventions utilized:  CBT, Solution Focused, and Supportive      Assessment: Patient reports having a difficult week last week with increased fatigue which made it more difficult to cope emotionally and mentally. Pt has recognized some of the negative messaging to herself (I'm lazy, I'm not healthy) related to decreased movement.  Discussed today reframing by including fact based statements (ex: I'm doing the recommended amount of weekly exercise despite feeling fatigued).    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  Discussed setting achievable steps toward a goal to try to meet the sense of accomplishment and feedback.     Plan: Follow up with CSW: 2 weeks- virtual Behavioral recommendations: Write down a helpful reframe for 1-2 recurring unhelpful thoughts. Write down a goal  and achievable steps to meet it (if having trouble with steps, bring goal to next session). Remember that you are human and need breaks sometimes, regardless of having experienced cancer Referral(s): Tai Chi & Chi gong classes. Peer mentor referral 11/11/22       Asani Mcburney E Asael Pann, LCSW

## 2022-11-25 NOTE — Progress Notes (Signed)
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) She has mild intermittent abdominal discomfort with occasional bloating She has background history of retroperitoneal fibrosis I recommend CT imaging to be done before her appointment to see GYN surgeon in June Continue supportive care  Pulmonary nodules She had history of PE and pulmonary nodules I will order repeat imaging study in June as above  Orders Placed This Encounter  Procedures   CT CHEST ABDOMEN PELVIS W CONTRAST    Standing Status:   Future    Standing Expiration Date:   11/25/2023    Order Specific Question:   Preferred imaging location?    Answer:   [facility]    Order Specific Question:   Radiology Contrast Protocol - do NOT remove file path    Answer:   [email or web address]    Order Specific Question:   Is patient pregnant?    Answer:   No    All questions were answered. The patient knows to call the clinic with any problems, questions or concerns. The total time spent in the appointment was 20 minutes encounter with patients including review of chart and various tests results, discussions about plan of care and coordination of care plan   Artis Delay, MD 11/25/2022 11:55 AM  INTERVAL HISTORY: Please see below for problem oriented charting. she returns for treatment follow-up and surveillance for history of ovarian cancer She has occasional bloating Bowel habits are stable She has difficulties with energy and occasional dizziness She admits she is not drinking enough fluid She is still trying to motivate herself with exercise as much as she can tolerate  REVIEW OF SYSTEMS:   Constitutional: Denies fevers, chills or abnormal weight loss Eyes: Denies blurriness of vision Ears, nose, mouth, throat, and face: Denies mucositis or sore  throat Respiratory: Denies cough, dyspnea or wheezes Cardiovascular: Denies palpitation, chest discomfort or lower extremity swelling Gastrointestinal:  Denies nausea, heartburn or change in bowel habits Skin: Denies abnormal skin rashes Lymphatics: Denies new lymphadenopathy or easy bruising Neurological:Denies numbness, tingling or new weaknesses Behavioral/Psych: Mood is stable, no new changes  All other systems were reviewed with the patient and are negative.  I have reviewed the past medical history, past surgical history, social history and family history with the patient and they are unchanged from previous note.  ALLERGIES:  is allergic to heparin, beef-derived products, and pork-derived products.  MEDICATIONS:  Current Outpatient Medications  Medication Sig Dispense Refill   acetaminophen (TYLENOL) 500 MG tablet Take 1,000 mg by mouth every 6 (six) hours as needed for mild pain.     EPINEPHrine 0.3 mg/0.3 mL IJ SOAJ injection Inject into the muscle.     famotidine (PEPCID) 20 MG tablet Take 1 tablet (20 mg total) by mouth at bedtime. (Patient taking differently: Take 20 mg by mouth daily as needed for heartburn.)     pantoprazole (PROTONIX) 40 MG tablet Take 1 tablet (40 mg total) by mouth daily. 90 tablet 1   traZODone (DESYREL) 100 MG tablet TAKE 1 TABLET BY MOUTH AT BEDTIME AS NEEDED FOR SLEEP. 90 tablet 1   valACYclovir (VALTREX) 500 MG tablet Take 2,000 mg by mouth See admin instructions. Loading dose of 2,000 mg BID for 1 day PRN cold sore.     No current facility-administered medications for this visit.    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 She has never undergone biopsy of the retroperitoneal tissue and its been historically activated to her  multiple abdominal surgeries. MRI scan on 06/2020 showed fatty infiltration of the retroperitoneal space surrounding the aorta and IVC to the bifurcation with subcentimeter retroperitoneal nodes that appeared stable to marginally improved from prior CT, consistent with retroperitoneal fibrosis. At her most recent visit in November, she reported that she was overall doing well, continues to have chronic intermittent right upper quadrant abdominal pain. She had a repeat CT scan in November 2022 which showed an abnormal appearance of the left ovary with somewhat masslike appearance and ill-defined margins. She then had a follow-up ultrasound in November 2022 that showed a moderately lobulated mass within the left adnexa that was solid with mild peripheral and central vascularity, measuring about 4 cm, O-RADS 4, no findings of ascites. She has a history of endometriosis (s/p [facility] ovarian cystectomies for several endometriomas and is s/p [facility] hysterectomy).   07/09/2021 Imaging   Ct abdomen and pelvis Abnormal appearance of the left ovary with somewhat mass like appearance and ill defined margins, possibly neoplasm.  Recommend pelvic ultrasound for further evaluation.   Infiltrative changes of the retroperitoneum, similar compared with previous examination, consistent with known retroperitoneal fibrosis     09/19/2021 Pathology Results   A. Ovary and fallopian tube, left, salpingo-oophorectomy - Endometrioid adenocarcinoma, seromucinous type, FIGO grade 1, present as multiple foci of invasive carcinoma arising in a background of serousmucinous borderline tumor, see comment and synoptic report -Ovarian surface involvement is identified -Left fallopian tube with focal endometriosis and focal foreign body giant cell reaction consistent with prior surgical intervention, no involvement by tumor identified   B.  Ovary and Fallopian tube, right, salpingo-oophorectomy -Serous cystadenoma, 1.7 cm, right ovary   -Background ovary with focal endometriosis and surface adhesions -Right fallopian tube with serosal adhesions -No involvement by tumor is identified   C.  Omental biopsy -Benign adipose tissue, no involvement by tumor identified   09/19/2021 Surgery   Procedures: Robotic assisted bilateral salpingo-oophorectomy (CPT [phone]), omental biopsy, bilateral ureterolysis (CPT 19147), lysis of adhesions  Surgeon: Ronita Hipps, MD  Assistants: Clide Cliff, MD - Fellow, Ronnette Hila, MD - Resident Findings: On entry to abdomen, dense adhesions of the omentum to the anterior abdominal wall, unable to visualize upper abdomen, specifically unable to visualize the diaphragm, liver, stomach. Omentum grossly normal apart from adhesions. Entry site inspected with no trauma to surrounding structures. Bilateral complex ovarian masses, left approximately 4cm and right 3cm. Bilateral ovarian masses densely adherent to the pelvic sidewalls, and the right mass adherent to the mesentery of the rectum. Uterus and cervix surgically absent. Retroperitoneal fibrosis bilaterally requiring ureterolysis from the pelvic brim to the insertion into the bladder bilaterally. IOFS c/w borderline tumor.   Specimens:  ID Type Source Tests Collected by Time Destination  1 : left tube and ovary Tissue Ovary, Left SURGICAL PATHOLOGY EXAM Clide Cliff, MD 09/19/2021 [phone]  2 : pelvic washing Washing Pelvic cavity CYTOLOGY - EXAM FROM OPTIME Haze Boyden, MD 09/19/2021 [phone]  3 : right ovary and tube Tissue Ovary, Right SURGICAL PATHOLOGY EXAM Haze Boyden, MD 09/19/2021 1012  4 : omental biopsy Tissue Omentum SURGICAL PATHOLOGY EXAM Haze Boyden, MD 09/19/2021 1012    10/29/2021 Procedure   Successful placement of  a single lumen Power port catheter in the right internal jugular vein [facility]   10/30/2021 -  Chemotherapy   She [facility]    11/04/2021 Initial Diagnosis   Left ovarian epithelial cancer (HCC)   11/04/2021 Cancer Staging   Staging form: Ovary, Fallopian Tube, and Primary Peritoneal Carcinoma, AJCC 8th Edition - Pathologic stage from 11/04/2021: FIGO Stage IC2, calculated as Stage IC (pT1c2, pN0, cM0) - Signed by Artis DelayGorsuch, Galaxy Borden, MD on 11/04/2021 Stage prefix: Initial diagnosis   11/11/2021 Imaging   1. Right-sided venous Port-A-Cath positioning, as described above. 2. No acute or active cardiopulmonary disease.   11/19/2021 - 12/31/2021 Chemotherapy   Patient is on Treatment Plan : OVARIAN Carboplatin (AUC 6) / Paclitaxel (175) q21d x 4 cycles ([facility]     01/02/2022 Genetic Testing   Negative genetic testing on the cancerNext-Expanded+RNAinsight panel.  The report date is Jan 02, 2022.  The CancerNext-Expanded gene panel offered by [facility] and includes sequencing and rearrangement analysis for the following 77 genes: AIP, ALK, APC*, ATM*, AXIN2, BAP1, BARD1, BLM, BMPR1A, BRCA1*, BRCA2*, BRIP1*, CDC73, CDH1*, CDK4, CDKN1B, CDKN2A, CHEK2*, CTNNA1, DICER1, FANCC, FH, FLCN, GALNT12, KIF1B, LZTR1, MAX, MEN1, MET, MLH1*, MSH2*, MSH3, MSH6*, MUTYH*, NBN, NF1*, NF2, NTHL1, PALB2*, PHOX2B, PMS2*, POT1, PRKAR1A, PTCH1, PTEN*, RAD51C*, RAD51D*, RB1, RECQL, RET, SDHA, SDHAF2, SDHB, SDHC, SDHD, SMAD4, SMARCA4, SMARCB1, SMARCE1, STK11, SUFU, TMEM127, TP53*, TSC1, TSC2, VHL and XRCC2 (sequencing and deletion/duplication); EGFR, EGLN1, HOXB13, KIT, MITF, PDGFRA, POLD1, and POLE (sequencing only); EPCAM and GREM1 (deletion/duplication only). DNA and RNA analyses performed for * genes.    01/07/2022 Imaging   1. Acute pulmonary embolus in the right middle lobe pulmonary artery and segmental branches to the right and left lower lobes. No evidence for right heart strain. RV/LV ratio is 0.78. 2. Atelectasis in the lung bases. Component of pulmonary infarct in the right lower lobe not excluded. 3. Small bilateral  pulmonary nodules measuring up to 8 mm.   02/21/2022 Imaging   1. Small LEFT LEFT lower lobe pulmonary nodule approximally 8 mm greatest axial dimension is not changed in the very short interval but warrants attention on subsequent imaging. Comparison with more remote prior exams may be helpful to determine whether this is a finding of significance. Consider short interval follow-up if this is not documented on more remote imaging to ensure stability. 2. Area in the porta hepatis reflects biliary to enteric anastomosis following biliary reconstruction with small bowel anastomosis in the upper abdomen. 3. Stranding and or tiny areas of nodal tissue around the aorta. This is nonspecific but can be seen in the setting of early retroperitoneal fibrosis, apparently this patient has been followed for this finding since 2013. No remote priors are available to allow for comparison at this time. 4. No definitive signs of metastatic or recurrent disease.     02/21/2022 Tumor Marker   Patient's tumor was tested for the following markers: CA-125. Results of the tumor marker test revealed 23.9.   05/30/2022 Tumor Marker   Patient's tumor was tested for the following markers: CA-125. Results of the tumor marker test revealed 13.6.   06/10/2022 Procedure   Removal of implanted Port-A-Cath utilizing sharp and blunt dissection. The procedure was uncomplicated.       PHYSICAL EXAMINATION: ECOG PERFORMANCE STATUS: 1 - Symptomatic but completely ambulatory  Vitals:   11/25/22 1001  BP: 110/83  Pulse: 83  Resp: 18  Temp: 97.6 F (36.4 C)  SpO2: 100%   Filed Weights   11/25/22 1001  Weight: 160 lb 3.2 oz (72.7 kg)    GENERAL:alert, no distress and comfortable  NEURO: alert & oriented x 3 with fluent speech, no focal motor/sensory deficits  LABORATORY DATA:  I have reviewed the data as listed    Component Value Date/Time   NA 142 11/25/2022 0950   K 4.3 11/25/2022 0950   CL 107 11/25/2022 0950    CO2 28 11/25/2022 0950   GLUCOSE 97 11/25/2022 0950   BUN 16 11/25/2022 0950   CREATININE 0.94 11/25/2022 0950   CREATININE 0.84 01/21/2022 0923   CALCIUM 9.9 11/25/2022 0950   PROT 7.4 11/25/2022 0950   ALBUMIN 4.7 11/25/2022 0950   AST 12 (L) 11/25/2022 0950   AST 14 (L) 01/21/2022 0923   ALT 14 11/25/2022 0950   ALT 18 01/21/2022 0923   ALKPHOS 78 11/25/2022 0950   BILITOT 0.6 11/25/2022 0950   BILITOT 0.4 01/21/2022 0923   GFRNONAA >60 11/25/2022 0950   GFRNONAA >60 01/21/2022 0923    No results found for: "SPEP", "UPEP"  Lab Results  Component Value Date   WBC 6.1 11/25/2022   NEUTROABS 3.4 11/25/2022   HGB 13.8 11/25/2022   HCT 41.9 11/25/2022   MCV 86.9 11/25/2022   PLT 239 11/25/2022      Chemistry      Component Value Date/Time   NA 142 11/25/2022 0950   K 4.3 11/25/2022 0950   CL 107 11/25/2022 0950   CO2 28 11/25/2022 0950   BUN 16 11/25/2022 0950   CREATININE 0.94 11/25/2022 0950   CREATININE 0.84 01/21/2022 0923      Component Value Date/Time   CALCIUM 9.9 11/25/2022 0950   ALKPHOS 78 11/25/2022 0950   AST 12 (L) 11/25/2022 0950   AST 14 (L) 01/21/2022 0923   ALT 14 11/25/2022 0950   ALT 18 01/21/2022 0923   BILITOT 0.6 11/25/2022 0950   BILITOT 0.4 01/21/2022 9450

## 2022-11-25 NOTE — Assessment & Plan Note (Signed)
She had history of PE and pulmonary nodules I will order repeat imaging study in June as above

## 2022-11-27 LAB — CA 125: Cancer Antigen (CA) 125: 14 U/mL (ref 0.0–38.1)

## 2022-12-04 ENCOUNTER — Other Ambulatory Visit: Payer: Self-pay | Admitting: Hematology and Oncology

## 2022-12-04 ENCOUNTER — Encounter: Payer: Self-pay | Admitting: Hematology and Oncology

## 2022-12-04 MED ORDER — PANTOPRAZOLE SODIUM 20 MG PO TBEC: 20.0000 mg | DELAYED_RELEASE_TABLET | Freq: Every day | ORAL | 3 refills | Status: AC

## 2022-12-09 ENCOUNTER — Inpatient Hospital Stay: Payer: Commercial Managed Care - PPO | Admitting: Licensed Clinical Social Worker

## 2022-12-22 ENCOUNTER — Inpatient Hospital Stay: Payer: Commercial Managed Care - PPO | Attending: Gynecologic Oncology | Admitting: Licensed Clinical Social Worker

## 2022-12-22 NOTE — Progress Notes (Signed)
[facility] CSW Counseling Note  Patient was referred by medical provider. Treatment type: Individual   Presenting Concerns: Patient and/or family reports the following symptoms/concerns: stress Duration of problem: ~4 months; Severity of problem: moderate   Orientation:oriented to person, place, time/date, and situation.   Affect: Appropriate and Congruent Risk of harm to self or others: No plan to harm self or others  Patient and/or Family's Strengths/Protective Factors: Social connections and Concrete supports in place (healthy food, safe environments, etc.)Capable of independent living  Printmaker for treatment/growth  Special hobby/interest  Supportive family/friends      Goals Addressed: Patient will:  Reduce symptoms of: anxiety and stress Increase knowledge and/or ability of: coping skills  Increase healthy adjustment to current life circumstances   Progress towards Goals: Progressing   Interventions: Interventions utilized:  CBT, Solution Focused, and Supportive      Assessment: Patient reports improvement in mood and physical functioning since last visit. She recently returned from her trip to [place] and was able to find time to meditate/ground herself each day (watching surfing) and move more towards acceptance of the changes she has experienced.  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  Explored figuring out what her internal goals are versus what goals others have for her.  Pt has found it helpful to talk with a friend about the immediate gratification and feedback she received from competition and continues exploring how to find this going forward.       Plan: Follow up with CSW: 2 weeks- virtual Behavioral recommendations: Continue trying to reframe and/or acknowledge the progress and not just the areas of growth.  Physically-  continue with small, achievable goals towards increases (walk/jogs).  Try to incorporate 5 minutes meditative time daily, possibly with pictures from your trip & beach sounds.   Referral(s): Tai Chi & Chi gong classes. Peer mentor referral 11/11/22       Avalin Briley E Jabree Rebert, LCSW

## 2023-01-06 ENCOUNTER — Telehealth: Payer: Self-pay

## 2023-01-06 ENCOUNTER — Inpatient Hospital Stay: Payer: Commercial Managed Care - PPO | Admitting: Licensed Clinical Social Worker

## 2023-01-06 ENCOUNTER — Other Ambulatory Visit: Payer: Self-pay

## 2023-01-06 ENCOUNTER — Encounter: Payer: Self-pay | Admitting: Gynecologic Oncology

## 2023-01-06 MED ORDER — ACYCLOVIR 400 MG PO TABS: 400.0000 mg | ORAL_TABLET | Freq: Every day | ORAL | 2 refills | Status: AC

## 2023-01-06 NOTE — Telephone Encounter (Signed)
Does she have active sores now? If prophylaxis, we can just do acyclovir 400 mg daily If active, I recommend Valtrex 1g TID for 1 week Ok to e-scribe

## 2023-01-06 NOTE — Telephone Encounter (Signed)
Returned her call. She is asking if you can send Acyclovir or [facility]alt[facility] to [facility]? She takes it as needed for cold sores. She is still trying to get PCP and said please.

## 2023-01-06 NOTE — Telephone Encounter (Signed)
Called her. She needs prophylaxis and does not have a outbreak at this time. Acyclovir Rx sent to her preferred pharmacy.

## 2023-01-06 NOTE — Progress Notes (Signed)
[facility] CSW Counseling Note  Patient was referred by medical provider. Treatment type: Individual   Presenting Concerns: Patient and/or family reports the following symptoms/concerns: stress Duration of problem: ~4 months; Severity of problem: mild   Orientation:oriented to person, place, time/date, and situation.   Affect: Appropriate and Congruent Risk of harm to self or others: No plan to harm self or others  Patient and/or Family's Strengths/Protective Factors: Social connections and Concrete supports in place (healthy food, safe environments, etc.)Capable of independent living  Printmaker for treatment/growth  Special hobby/interest  Supportive family/friends      Goals Addressed: Patient will:  Reduce symptoms of: anxiety and stress Increase knowledge and/or ability of: coping skills  Increase healthy adjustment to current life circumstances   Progress towards Goals: Progressing   Interventions: Interventions utilized:  CBT and Supportive      Assessment: Patient reports continued improvement in mood and physical functioning since last visit. She has had improvement in energy, noticing changes physically (more muscle tone), feeling more positive emotions and passion, and able to let go of stress and other's expectations more quickly. Pt has been engaging in rowing and cycling and was able to have a positive experience on the boat during calm weather. She has also been able to maintain some of the meditation (3-5 minutes) and food changes that she found helpful in [place]. Pt also reports positive connection with her Al[employer] guide.     Plan: Follow up with CSW: 4 weeks- virtual Behavioral recommendations: Continue current coping skills and activities (eating, movement, acceptance, meditation/grounding).   Referral(s): Tai Chi & Chi gong classes. Peer mentor referral 11/11/22       Ante Arredondo E Robi Mitter, LCSW

## 2023-01-26 ENCOUNTER — Telehealth: Payer: Self-pay

## 2023-01-26 NOTE — Telephone Encounter (Signed)
Called and left a message asking her to call the office back. CT was supposed to scheduled prior to Dr. Everitt Amber appt on 6/14. Does she want to keep Dr. Everitt Amber appt or reschedule. Dr. Bertis Ruddy would need to see her on 7/16 and labs would need to be moved to prior to CT on 7/11.

## 2023-01-28 ENCOUNTER — Ambulatory Visit (HOSPITAL_COMMUNITY): Payer: Commercial Managed Care - PPO

## 2023-01-28 ENCOUNTER — Inpatient Hospital Stay: Payer: Commercial Managed Care - PPO

## 2023-01-29 ENCOUNTER — Telehealth: Payer: Self-pay | Admitting: *Deleted

## 2023-01-29 NOTE — Telephone Encounter (Signed)
Spoke with the patient and gave new appts

## 2023-01-30 ENCOUNTER — Inpatient Hospital Stay: Payer: Commercial Managed Care - PPO | Admitting: Gynecologic Oncology

## 2023-01-30 ENCOUNTER — Other Ambulatory Visit: Payer: Commercial Managed Care - PPO

## 2023-02-02 ENCOUNTER — Encounter: Payer: Self-pay | Admitting: Gynecologic Oncology

## 2023-02-03 ENCOUNTER — Telehealth: Payer: Self-pay | Admitting: Oncology

## 2023-02-03 ENCOUNTER — Inpatient Hospital Stay: Payer: Commercial Managed Care - PPO | Attending: Gynecologic Oncology | Admitting: Licensed Clinical Social Worker

## 2023-02-03 DIAGNOSIS — Z8543 Personal history of malignant neoplasm of ovary: Secondary | ICD-10-CM | POA: Insufficient documentation

## 2023-02-03 NOTE — Telephone Encounter (Signed)
Called Katy regarding her MyChart message.  She will be moving to [place], [place] on 02/26/23.  She would like to move up her CT scan if possible to before she leaves.  She is concerned about her insurance coverage and will have coverage until the end of July.  Her new insurance will not start until 04/02/23.  She is wondering if Dr. Pricilla Holm knows of any Gyn Oncologists in the [place] area.  CT scan has been rescheduled to 02/12/23 at 5:30.  Notified Cindy of new appointment and instructions and also rescheduled her lab appointment to the same day at 3:00 at the [facility].  Asked if we could reschedule Dr. Winferd Humphrey appointment to 02/20/23 and she unfortunately will be out of town.  She would like to keep her current appointment on 03/12/23 or possibly have a telephone appointment to discuss her CT results.

## 2023-02-05 ENCOUNTER — Inpatient Hospital Stay: Payer: Commercial Managed Care - PPO | Admitting: Licensed Clinical Social Worker

## 2023-02-09 NOTE — Telephone Encounter (Signed)
[facility] has a gyn onc group. I don't know anyone there, but we can certainly send a referral if she'd like to see them.

## 2023-02-10 ENCOUNTER — Inpatient Hospital Stay: Payer: Commercial Managed Care - PPO | Admitting: Licensed Clinical Social Worker

## 2023-02-10 NOTE — Progress Notes (Signed)
[facility] CSW Counseling Note  Patient was referred by medical provider. Treatment type: Individual   Presenting Concerns: Patient and/or family reports the following symptoms/concerns: stress Duration of problem: ~4 months; Severity of problem: mild   Orientation:oriented to person, place, time/date, and situation.   Affect: Appropriate and Congruent Risk of harm to self or others: No plan to harm self or others  Patient and/or Family's Strengths/Protective Factors: Social connections and Concrete supports in place (healthy food, safe environments, etc.)Capable of independent living  Printmaker for treatment/growth  Special hobby/interest  Supportive family/friends      Goals Addressed: Patient will:  Reduce symptoms of: anxiety and stress Increase knowledge and/or ability of: coping skills  Increase healthy adjustment to current life circumstances   Progress towards Goals: Progressing   Interventions: Interventions utilized:  CBT and Supportive      Assessment: Patient has had many changes since last visit physically and with social circumstances. She has been dealing with significantly more pain and has scans coming up this week and is concerned for recurrence.  Pt reports that she is not panicked and has been at a better place of acceptance, but still worries. CSW normalized feelings and commended pt for continuing her meditation and mindfulness to help with accepting where she is in the moment. Pt has a clear idea of the quality of life she wants if this is a recurrence.  [place] Pos[place] and already having some established supports in the area.  Pt was also able to attend and enjoy her son's wedding this past weekend.     Plan: Follow up with CSW: 2 weeks- virtual Behavioral recommendations: Continue current coping  skills and activities (eating, acceptance, meditation/grounding).  Prioritize what needs to be done in this moment versus later for moving. Contact MD regarding pain if preventing you from sleep Referral(s): Tai Chi & Chi gong classes. Peer mentor referral 11/11/22       Kristine White E Kristine Hearst, LCSW

## 2023-02-11 NOTE — Telephone Encounter (Signed)
Left a message advising of message below from Dr. Pricilla Holm.  Requested a return call if she would like [place] to send a referral.

## 2023-02-12 ENCOUNTER — Inpatient Hospital Stay: Payer: Commercial Managed Care - PPO

## 2023-02-12 ENCOUNTER — Telehealth: Payer: Self-pay | Admitting: Surgery

## 2023-02-12 ENCOUNTER — Ambulatory Visit (HOSPITAL_COMMUNITY)
Admission: RE | Admit: 2023-02-12 | Discharge: 2023-02-12 | Disposition: A | Discharge status: Home / Self Care | Payer: Commercial Managed Care - PPO | Source: Ambulatory Visit | Attending: Hematology and Oncology | Admitting: Hematology and Oncology

## 2023-02-12 ENCOUNTER — Other Ambulatory Visit: Payer: Self-pay

## 2023-02-12 ENCOUNTER — Encounter: Payer: Self-pay | Admitting: Gynecologic Oncology

## 2023-02-12 DIAGNOSIS — C562 Malignant neoplasm of left ovary: Secondary | ICD-10-CM

## 2023-02-12 DIAGNOSIS — I2699 Other pulmonary embolism without acute cor pulmonale: Secondary | ICD-10-CM | POA: Insufficient documentation

## 2023-02-12 DIAGNOSIS — Z8543 Personal history of malignant neoplasm of ovary: Secondary | ICD-10-CM | POA: Diagnosis not present

## 2023-02-12 LAB — CBC WITH DIFFERENTIAL/PLATELET
Abs Immature Granulocytes: 0.01 10*3/uL (ref 0.00–0.07)
Basophils Absolute: 0.1 10*3/uL (ref 0.0–0.1)
Basophils Relative: 1 %
Eosinophils Absolute: 0.2 10*3/uL (ref 0.0–0.5)
Eosinophils Relative: 3 %
HCT: 39.8 % (ref 36.0–46.0)
Hemoglobin: 13 g/dL (ref 12.0–15.0)
Immature Granulocytes: 0 %
Lymphocytes Relative: 34 %
Lymphs Abs: 2.5 10*3/uL (ref 0.7–4.0)
MCH: 28.9 pg (ref 26.0–34.0)
MCHC: 32.7 g/dL (ref 30.0–36.0)
MCV: 88.4 fL (ref 80.0–100.0)
Monocytes Absolute: 0.6 10*3/uL (ref 0.1–1.0)
Monocytes Relative: 8 %
Neutro Abs: 3.9 10*3/uL (ref 1.7–7.7)
Neutrophils Relative %: 54 %
Platelets: 214 10*3/uL (ref 150–400)
RBC: 4.5 MIL/uL (ref 3.87–5.11)
RDW: 13 % (ref 11.5–15.5)
WBC: 7.3 10*3/uL (ref 4.0–10.5)
nRBC: 0 % (ref 0.0–0.2)

## 2023-02-12 LAB — COMPREHENSIVE METABOLIC PANEL
ALT: 13 U/L (ref 0–44)
AST: 11 U/L — ABNORMAL LOW (ref 15–41)
Albumin: 4.2 g/dL (ref 3.5–5.0)
Alkaline Phosphatase: 67 U/L (ref 38–126)
Anion gap: 5 (ref 5–15)
BUN: 19 mg/dL (ref 6–20)
CO2: 30 mmol/L (ref 22–32)
Calcium: 9.4 mg/dL (ref 8.9–10.3)
Chloride: 108 mmol/L (ref 98–111)
Creatinine, Ser: 1.05 mg/dL — ABNORMAL HIGH (ref 0.44–1.00)
GFR, Estimated: >60 mL/min (ref >60)
Glucose, Bld: 74 mg/dL (ref 70–99)
Potassium: 4 mmol/L (ref 3.5–5.1)
Sodium: 143 mmol/L (ref 135–145)
Total Bilirubin: 0.3 mg/dL (ref 0.3–1.2)
Total Protein: 6.7 g/dL (ref 6.5–8.1)

## 2023-02-12 MED ORDER — IOHEXOL 9 MG/ML PO SOLN
1000.0000 mL | Freq: Once | ORAL | Status: AC
Start: 2023-02-12 — End: 2023-02-12
  Administered 2023-02-12: 1000 mL via ORAL

## 2023-02-12 MED ORDER — IOHEXOL 300 MG/ML  SOLN
100.0000 mL | Freq: Once | INTRAMUSCULAR | Status: AC | PRN
  Administered 2023-02-12: 100 mL via INTRAVENOUS

## 2023-02-12 NOTE — Telephone Encounter (Signed)
Called patient to respond to mychart message and let her know that she needs to arrive at 3:30pm to drink the oral contrast. She does not need to go anywhere else to get the oral contrast, it will be given to her upon arrival for her CT. Patient thanked me for this information and had no other concerns at this time.

## 2023-02-14 LAB — CA 125: Cancer Antigen (CA) 125: 13.6 U/mL (ref 0.0–38.1)

## 2023-02-20 ENCOUNTER — Ambulatory Visit: Payer: Commercial Managed Care - PPO | Admitting: Gynecologic Oncology

## 2023-02-24 ENCOUNTER — Inpatient Hospital Stay: Payer: Commercial Managed Care - PPO | Admitting: Licensed Clinical Social Worker

## 2023-02-25 ENCOUNTER — Inpatient Hospital Stay: Payer: Commercial Managed Care - PPO

## 2023-02-26 ENCOUNTER — Other Ambulatory Visit (HOSPITAL_COMMUNITY): Payer: Commercial Managed Care - PPO

## 2023-03-10 ENCOUNTER — Telehealth: Payer: Self-pay

## 2023-03-10 NOTE — Telephone Encounter (Signed)
Patient called in and stated that she had moved earlier than expected and wanted to change her appointment on 7/25 to a phone visit or virtual visit.  I spoke with Efraim Kaufmann (NP) and changed her appointment to a phone visit with Dr Pricilla Holm at the same time 2:30pm.  She also asked about speaking with someone to get a referral to the place where she has moved to.  I advised her to speak with Dr Pricilla Holm about that on Thursday.  Patient confirmed and understood.

## 2023-03-12 ENCOUNTER — Ambulatory Visit: Payer: Commercial Managed Care - PPO | Admitting: Gynecologic Oncology

## 2023-03-12 ENCOUNTER — Inpatient Hospital Stay: Payer: Commercial Managed Care - PPO | Attending: Gynecologic Oncology | Admitting: Gynecologic Oncology

## 2023-03-12 ENCOUNTER — Encounter: Payer: Self-pay | Admitting: Gynecologic Oncology

## 2023-03-12 DIAGNOSIS — C562 Malignant neoplasm of left ovary: Secondary | ICD-10-CM | POA: Diagnosis not present

## 2023-03-12 NOTE — Progress Notes (Signed)
Gynecologic Oncology Telehealth Note: Gyn-Onc  I connected with Kristine White on 03/12/23 at  2:30 PM EDT by telephone and verified that I am speaking with the correct person using two identifiers.  I discussed the limitations, risks, security and privacy concerns of performing an evaluation and management service by telemedicine and the availability of in-person appointments. I also discussed with the patient that there may be a patient responsible charge related to this service. The patient expressed understanding and agreed to proceed.  Other persons participating in the visit and their role in the encounter: none.  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 She has never undergone biopsy of the retroperitoneal tissue and its been historically activated to her multiple abdominal surgeries. MRI scan on 06/2020 showed fatty infiltration of the retroperitoneal space surrounding the aorta and IVC to the bifurcation with subcentimeter retroperitoneal nodes that appeared stable to marginally improved from prior CT, consistent with retroperitoneal fibrosis. At her most recent visit in November, she reported that she was overall doing well, continues to have chronic intermittent right upper quadrant abdominal pain. She had a repeat CT scan in November 2022 which showed an abnormal appearance of the left ovary with somewhat masslike appearance and ill-defined margins. She then had a follow-up ultrasound in November 2022 that showed a moderately lobulated mass within the left adnexa that was solid with mild peripheral and central vascularity, measuring about 4 cm, O-RADS 4, no findings of ascites. She has a history  of endometriosis (s/p [facility]SC ovarian cystectomies for several endometriomas and is s/p [facility] hysterectomy).   07/09/2021 Imaging   Ct abdomen and pelvis Abnormal appearance of the left ovary with somewhat mass like appearance and ill defined margins, possibly neoplasm.  Recommend pelvic ultrasound for further evaluation.   Infiltrative changes of the retroperitoneum, similar compared with previous examination, consistent with known retroperitoneal fibrosis     09/19/2021 Pathology Results   A. Ovary and fallopian tube, left, salpingo-oophorectomy - Endometrioid adenocarcinoma, seromucinous type, FIGO grade 1, present as multiple foci of invasive carcinoma arising in a background of serousmucinous borderline tumor, see comment and synoptic report -Ovarian surface involvement is identified -Left fallopian tube with focal endometriosis and focal foreign body giant cell reaction consistent with prior surgical intervention, no involvement by tumor identified   B.  Ovary and Fallopian tube, right, salpingo-oophorectomy -Serous cystadenoma, 1.7 cm, right ovary  -Background ovary with focal endometriosis and surface adhesions -Right fallopian tube with serosal adhesions -No involvement by tumor is identified   C.  Omental biopsy -Benign adipose tissue, no involvement by tumor identified   09/19/2021 Surgery   Procedures: Robotic assisted bilateral salpingo-oophorectomy (CPT [phone]), omental biopsy, bilateral ureterolysis (CPT [id number]), lysis of adhesions  Surgeon: Ronita Hipps, MD  Assistants: Clide Cliff, MD - Fellow, Ronnette Hila, MD - Resident Findings: On entry to abdomen, dense adhesions of the omentum to the anterior abdominal wall, unable to visualize upper abdomen, specifically unable to visualize the diaphragm, liver, stomach. Omentum grossly normal apart from adhesions. Entry site inspected with no trauma to surrounding structures. Bilateral complex ovarian masses, left approximately 4cm and  right 3cm. Bilateral ovarian masses densely adherent to the pelvic sidewalls, and the right mass adherent to the mesentery of the rectum. Uterus and cervix  surgically absent. Retroperitoneal fibrosis bilaterally requiring ureterolysis from the pelvic brim to the insertion into the bladder bilaterally. IOFS c/w borderline tumor.   Specimens:  ID Type Source Tests Collected by Time Destination  1 : left tube and ovary Tissue Ovary, Left SURGICAL PATHOLOGY EXAM Clide Cliff, MD 09/19/2021 [phone]  2 : pelvic washing Washing Pelvic cavity CYTOLOGY - EXAM FROM OPTIME Haze Boyden, MD 09/19/2021 [phone]  3 : right ovary and tube Tissue Ovary, Right SURGICAL PATHOLOGY EXAM Haze Boyden, MD 09/19/2021 1012  4 : omental biopsy Tissue Omentum SURGICAL PATHOLOGY EXAM Haze Boyden, MD 09/19/2021 1012    10/29/2021 Procedure   Successful placement of a single lumen Power port catheter in the right internal jugular vein under ultrasound and fluoroscopy at [facility]   10/30/2021 -  Chemotherapy   She received first cycle of carboplatin and taxol at [facility]   11/04/2021 Initial Diagnosis   Left ovarian epithelial cancer (HCC)   11/04/2021 Cancer Staging   Staging form: Ovary, Fallopian Tube, and Primary Peritoneal Carcinoma, AJCC 8th Edition - Pathologic stage from 11/04/2021: FIGO Stage IC2, calculated as Stage IC (pT1c2, pN0, cM0) - Signed by Artis Delay, MD on 11/04/2021 Stage prefix: Initial diagnosis   11/11/2021 Imaging   1. Right-sided venous Port-A-Cath positioning, as described above. 2. No acute or active cardiopulmonary disease.   11/19/2021 - 12/31/2021 Chemotherapy   Patient is on Treatment Plan : OVARIAN Carboplatin (AUC 6) / Paclitaxel (175) q21d x 4 cycles (including 1 cycle from [facility])     01/02/2022 Genetic Testing   Negative genetic testing on the cancerNext-Expanded+RNAinsight panel.  The report date is Jan 02, 2022.  The CancerNext-Expanded gene panel offered by [facility] and  includes sequencing and rearrangement analysis for the following 77 genes: AIP, ALK, APC*, ATM*, AXIN2, BAP1, BARD1, BLM, BMPR1A, BRCA1*, BRCA2*, BRIP1*, CDC73, CDH1*, CDK4, CDKN1B, CDKN2A, CHEK2*, CTNNA1, DICER1, FANCC, FH, FLCN, GALNT12, KIF1B, LZTR1, MAX, MEN1, MET, MLH1*, MSH2*, MSH3, MSH6*, MUTYH*, NBN, NF1*, NF2, NTHL1, PALB2*, PHOX2B, PMS2*, POT1, PRKAR1A, PTCH1, PTEN*, RAD51C*, RAD51D*, RB1, RECQL, RET, SDHA, SDHAF2, SDHB, SDHC, SDHD, SMAD4, SMARCA4, SMARCB1, SMARCE1, STK11, SUFU, TMEM127, TP53*, TSC1, TSC2, VHL and XRCC2 (sequencing and deletion/duplication); EGFR, EGLN1, HOXB13, KIT, MITF, PDGFRA, POLD1, and POLE (sequencing only); EPCAM and GREM1 (deletion/duplication only). DNA and RNA analyses performed for * genes.    01/07/2022 Imaging   1. Acute pulmonary embolus in the right middle lobe pulmonary artery and segmental branches to the right and left lower lobes. No evidence for right heart strain. RV/LV ratio is 0.78. 2. Atelectasis in the lung bases. Component of pulmonary infarct in the right lower lobe not excluded. 3. Small bilateral pulmonary nodules measuring up to 8 mm.   02/21/2022 Imaging   1. Small LEFT LEFT lower lobe pulmonary nodule approximally 8 mm greatest axial dimension is not changed in the very short interval but warrants attention on subsequent imaging. Comparison with more remote prior exams may be helpful to determine whether this is a finding of significance. Consider short interval follow-up if this is not documented on more remote imaging to ensure stability. 2. Area in the porta hepatis reflects biliary to enteric anastomosis following biliary reconstruction with small bowel anastomosis in the upper abdomen. 3. Stranding and or tiny areas of nodal tissue around the aorta. This is nonspecific but can be seen in the setting of early retroperitoneal fibrosis, apparently this patient has been followed for this finding since 2013. No remote priors are available to allow  for comparison at this time. 4. No definitive signs of metastatic or recurrent disease.     02/21/2022 Tumor Marker   Patient's tumor was tested for the following markers: CA-125. Results of the tumor marker test revealed 23.9.   05/30/2022 Tumor Marker   Patient's tumor was tested for the following markers: CA-125. Results of the tumor marker test revealed 13.6.   06/10/2022 Procedure   Removal of implanted Port-A-Cath utilizing sharp and blunt dissection. The procedure was uncomplicated.     11/27/2022 Tumor Marker   Patient's tumor was tested for the following markers: CA-125. Results of the tumor marker test revealed 14.   02/13/2023 Imaging   CT CHEST ABDOMEN PELVIS W CONTRAST  Result Date: 02/12/2023 CLINICAL DATA:  History of ovarian cancer, monitor. * Tracking Code: BO *. EXAM: CT CHEST, ABDOMEN, AND PELVIS WITH CONTRAST TECHNIQUE: Multidetector CT imaging of the chest, abdomen and pelvis was performed following the standard protocol during bolus administration of intravenous contrast. RADIATION DOSE REDUCTION: This exam was performed according to the departmental dose-optimization program which includes automated exposure control, adjustment of the mA and/or kV according to patient size and/or use of iterative reconstruction technique. CONTRAST:  OMNIPAQUE IOHEXOL 300 MG/ML  SOLN COMPARISON:  Multiple priors including most recent CT dated June 30, 2022. FINDINGS: CT CHEST FINDINGS Cardiovascular: Normal caliber thoracic aorta. No central pulmonary embolus on this nondedicated study. Normal size heart. No significant pericardial effusion/thickening. Mediastinum/Nodes: No suspicious thyroid nodule. No pathologically enlarged mediastinal, hilar or axillary lymph nodes. Similar appearance of the feathery crescentic tissue in the anterior mediastinum which conforms to underlying vasculature and likely reflects thymic remnant/rebound. The esophagus is grossly unremarkable.  Lungs/Pleura: Stable perifissural 7 mm left lower lobe pulmonary nodule on image 72/4. No new suspicious pulmonary nodules or masses. No pleural effusion. No pneumothorax. Musculoskeletal: Ill-defined sclerotic focus in the posterior T11 vertebral body is stable dating back to Jan 07, 2022. No new suspicious lytic or blastic lesion of bone. CT ABDOMEN PELVIS FINDINGS Hepatobiliary: Stable hepatic cysts and hypodense hepatic lesions technically too small to accurately characterize. Gallbladder surgically absent. Similar pneumobilia. Pancreas: No pancreatic ductal dilation or evidence of acute inflammation. Spleen: No splenomegaly. Adrenals/Urinary Tract: Bilateral adrenal glands appear normal. No hydronephrosis. Similar prominence of the right ureter. No solid enhancing renal mass. Urinary bladder is unremarkable for degree of distension. Stomach/Bowel: Radiopaque enteric contrast material traverses distal loops of small bowel. Stomach is unremarkable for degree of distension. No pathologic dilation of small or large bowel. Small bowel anastomosis in the right upper quadrant and anterior abdomen. No evidence of acute bowel inflammation. Vascular/Lymphatic: Aortic atherosclerosis. Normal caliber abdominal aorta. Portal, splenic and superior mesenteric veins are patent. Similar appearance of the retroperitoneal periaortic soft tissue for instance on image 75/2. No discrete pathologically enlarged abdominal or pelvic lymph nodes. Reproductive: Uterus is surgically absent. No new nodularity along the vaginal cuff. No suspicious adnexal mass. Other: No significant abdominopelvic free fluid. No discrete peritoneal or omental nodularity. Musculoskeletal: No aggressive lytic or blastic lesion of bone. IMPRESSION: 1. Stable examination without evidence of new or progressive disease in the chest, abdomen or pelvis. 2. Stable ill-defined sclerotic lesion in the posterior T11 vertebral body unchanged dating back to Jan 07, 2022  possibly reflecting a bone island. Suggest attention on follow-up imaging. 3. Similar appearance of the retroperitoneal periaortic soft tissue, nonspecific but per medical history has been followed for this dating back to 2013 and likely reflecting retroperitoneal fibrosis. 4. Stable 7 mm perifissural  left lower lobe pulmonary nodule. 5.  Aortic Atherosclerosis (ICD10-I70.0). Electronically Signed   By: Maudry Mayhew M.D.   On: 02/12/2023 18:49      02/16/2023 Tumor Marker   Patient's tumor was tested for the following markers: CA-125. Results of the tumor marker test revealed 13.6.     Interval History: Moved to [place] about 2 weeks.   Still days with fatigue but overall feeling well. More days feeling better. Some improvement in finding joy in activities previously enjoyed. Talked to Marcelino Duster and now connected with one of our gyn cancer survivors. Both have been very help.  Had a couple weeks where had some increased pain at some of her incisions. Riding her bike more, physical activity (certain movements) seems to cause some discomfort.   Bowel and bladder function at baseline.  Past Medical/Surgical History: Past Medical History:  Diagnosis Date   Asthma    Bilateral pulmonary embolism (HCC) 01/07/2022   Endometriosis    Family history of breast cancer    Family history of ovarian cancer    GERD (gastroesophageal reflux disease)    IBS (irritable bowel syndrome)    Idiopathic angioedema    Juvenile polyp of colon    Juvenile polyposis syndrome    Pulmonary nodules 01/07/2022   Retroperitoneal fibrosis     Past Surgical History:  Procedure Laterality Date   ABDOMINAL HYSTERECTOMY     APPENDECTOMY     bile duct reconstruction     CHOLECYSTECTOMY     CHOLEDOCHOJEJUNOSTOMY     IR REMOVAL TUN ACCESS W/ PORT W/O FL MOD SED  06/10/2022   pancreatic duct surgery     POLYPECTOMY     TONSILLECTOMY      Family History  Problem Relation Age of Onset   Breast cancer Mother 90    Ovarian cancer Mother 67   Cancer Father        scrotal cancer   Lung cancer Father    Breast cancer Other    Kidney cancer Other    Breast cancer Half-Sister 2       Neg GT    Social History   Socioeconomic History   Marital status: Married    Spouse name: Lucienne Capers   Number of children: 2   Years of education: Not on file   Highest education level: Not on file  Occupational History   Occupation: clinical nurse specialist  Tobacco Use   Smoking status: Never   Smokeless tobacco: Never  Substance and Sexual Activity   Alcohol use: Not Currently   Drug use: Never   Sexual activity: Yes  Other Topics Concern   Not on file  Social History Narrative   Not on file   Social Determinants of Health   Financial Resource Strain: Not on file  Food Insecurity: Not on file  Transportation Needs: Not on file  Physical Activity: Not on file  Stress: Not on file  Social Connections: Not on file    Current Medications:  Current Outpatient Medications:    acetaminophen (TYLENOL) 500 MG tablet, Take 1,000 mg by mouth every 6 (six) hours as needed for mild pain., Disp: , Rfl:    acyclovir (ZOVIRAX) 400 MG tablet, Take 1 tablet (400 mg total) by mouth daily., Disp: 30 tablet, Rfl: 2   EPINEPHrine 0.3 mg/0.3 mL IJ SOAJ injection, Inject into the muscle., Disp: , Rfl:    famotidine (PEPCID) 20 MG tablet, Take 1 tablet (20 mg total) by mouth at bedtime. (Patient  taking differently: Take 20 mg by mouth daily as needed for heartburn.), Disp: , Rfl:    pantoprazole (PROTONIX) 20 MG tablet, Take 1 tablet (20 mg total) by mouth daily., Disp: 30 tablet, Rfl: 3   traZODone (DESYREL) 100 MG tablet, TAKE 1 TABLET BY MOUTH AT BEDTIME AS NEEDED FOR SLEEP., Disp: 90 tablet, Rfl: 1   valACYclovir (VALTREX) 500 MG tablet, Take 2,000 mg by mouth See admin instructions. Loading dose of 2,000 mg BID for 1 day PRN cold sore., Disp: , Rfl:   Review of Symptoms: Pertinent positives as per  HPI.  Physical Exam: Deferred given limitations of phone visit.  Laboratory & Radiologic Studies: Component Ref Range & Units 4 wk ago 3 mo ago 5 mo ago 9 mo ago 1 yr ago  Cancer Antigen (CA) 125 0.0 - 38.1 U/mL 13.6 14.0 CM 14.9 CM 13.6 CM 23.9 C   CT C/A/P 02/12/23: 1. Stable examination without evidence of new or progressive disease in the chest, abdomen or pelvis. 2. Stable ill-defined sclerotic lesion in the posterior T11 vertebral body unchanged dating back to Jan 07, 2022 possibly reflecting a bone island. Suggest attention on follow-up imaging. 3. Similar appearance of the retroperitoneal periaortic soft tissue, nonspecific but per medical history has been followed for this dating back to 2013 and likely reflecting retroperitoneal fibrosis. 4. Stable 7 mm perifissural left lower lobe pulmonary nodule. 5.  Aortic Atherosclerosis (ICD10-I70.0).  Assessment & Plan: Kristine White is a 57 y.o. 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 Genetic testing negative.  Patient is overall doing well.  We discussed her recent CT scan which shows no evidence of new or recurrent disease.  She had a recent Ca1 25 which was normal.  Creatinine at the time of her CT scan was just above the upper limits of normal at 1.05, very similar to what it has been recently.  We discussed that she may have just been a little dehydrated that day.  This can be followed on future BMPs.  Although we have discussed referral for colonoscopy (overdue for testing) at her last visit, she ultimately decided to delay this been her move.  She will work to get this set up closer to [employer].  She would like to establish care with GYN oncology at [facility].  I have asked Clydie Braun to my office to send a referral.  The patient has had improvement in her mood since speaking with Marcelino Duster and being assigned a peer  ovarian cancer survivor.  We discussed incorporating more physical activity which she is ready to do.  I discussed the assessment and treatment plan with the patient. The patient was provided with an opportunity to ask questions and all were answered. The patient agreed with the plan and demonstrated an understanding of the instructions.   The patient was advised to call back or see an in-person evaluation if the symptoms worsen or if the condition fails to improve as anticipated.   1 minutes of total time was spent for this patient encounter, including preparation, phone counseling with the patient and coordination of care, and documentation of the encounter.   Eugene Garnet, MD  Division of Gynecologic Oncology  Department of Obstetrics and Gynecology  [facility]

## 2023-04-02 ENCOUNTER — Encounter: Payer: Self-pay | Admitting: Gynecologic Oncology

## 2023-04-02 ENCOUNTER — Encounter: Payer: Self-pay | Admitting: Oncology

## 2023-04-02 ENCOUNTER — Telehealth: Payer: Self-pay | Admitting: Oncology

## 2023-04-02 NOTE — Telephone Encounter (Signed)
Left a message with phone number for [facility] - Gyn Oncology [phone]).

## 2023-04-09 ENCOUNTER — Encounter: Payer: Self-pay | Admitting: Gynecologic Oncology

## 2023-04-13 ENCOUNTER — Telehealth: Payer: Self-pay | Admitting: Oncology

## 2023-04-13 NOTE — Telephone Encounter (Signed)
Kristine White called and would like a referral sent to [facility] in [place].  She is having trouble scheduling an appointment with [facility].  She has family in [place] and would like to have her follow up care there.    Called [facility] and faxed referral and records to [phone].

## 2023-04-14 ENCOUNTER — Telehealth: Payer: Self-pay

## 2023-04-14 NOTE — Telephone Encounter (Signed)
Sent new patient referral to Dr Loma Boston... phone #[phone] was faxed to #[phone] per patient request,.

## 2023-04-28 ENCOUNTER — Inpatient Hospital Stay: Payer: Commercial Managed Care - PPO | Admitting: Hematology and Oncology

## 2023-04-28 ENCOUNTER — Inpatient Hospital Stay: Payer: Commercial Managed Care - PPO

## 2023-05-14 ENCOUNTER — Other Ambulatory Visit (HOSPITAL_COMMUNITY): Payer: Self-pay

## 2023-06-17 IMAGING — CT CT ANGIO CHEST
2 of 7 series · 17 of 46 positions shown · IV contrast (OMNIPAQUE)
Comparison: None Available.

CLINICAL DATA: Ovarian cancer with shortness of breath since
yesterday and positive D-dimer.

EXAM:
CT ANGIOGRAPHY CHEST WITH CONTRAST
TECHNIQUE: Multidetector CT imaging of the chest was performed using the
standard protocol during bolus administration of intravenous
contrast. Multiplanar CT image reconstructions and MIPs were
obtained to evaluate the vascular anatomy.

[Series 6: thins · axial · 0.69mm/px · z∈[+1298,+1531]mm · 15 of 267 slices shown]
[im 17/267  lung]
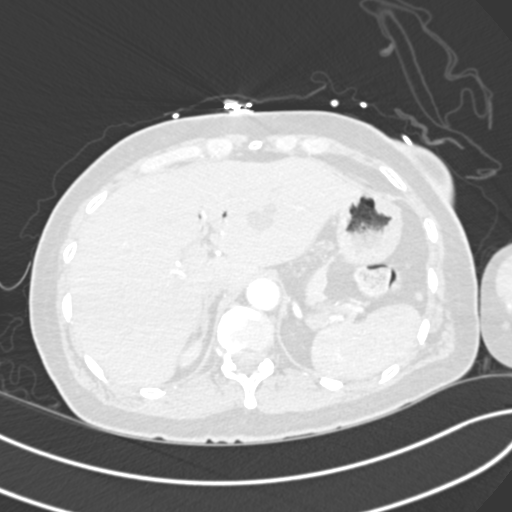
[im 34/267  soft-tissue]
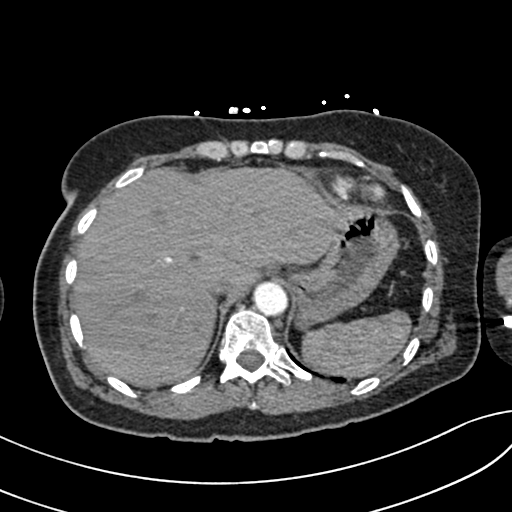
[im 50/267  lung]
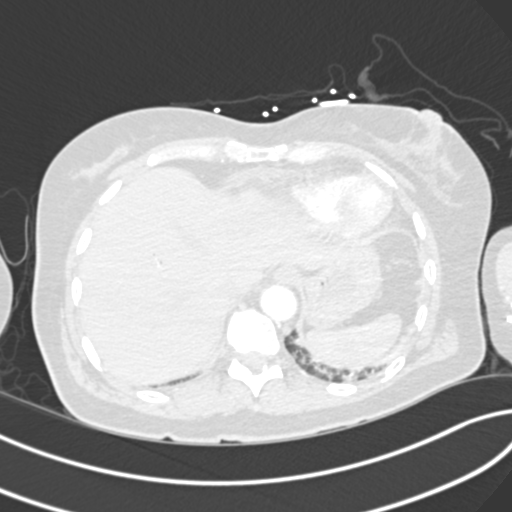
[im 67/267  soft-tissue]
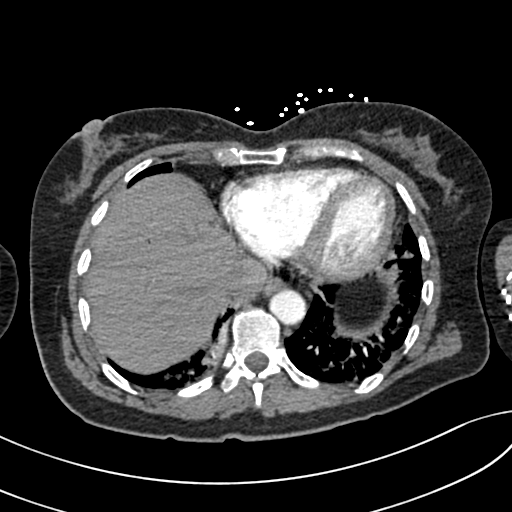
[im 84/267  lung]
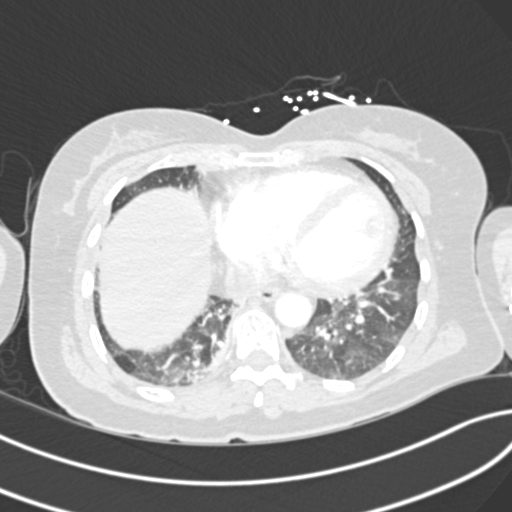
[im 100/267  soft-tissue]
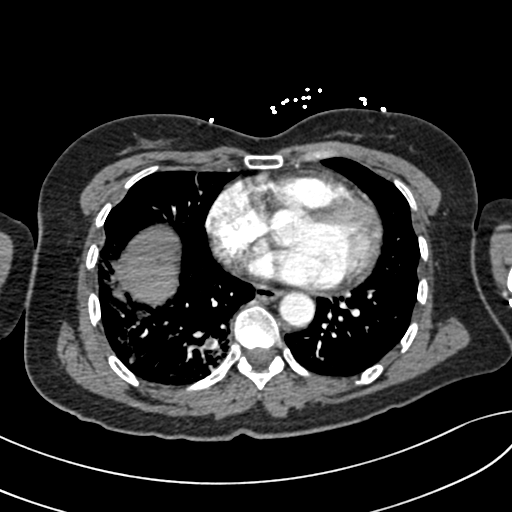
[im 117/267  lung]
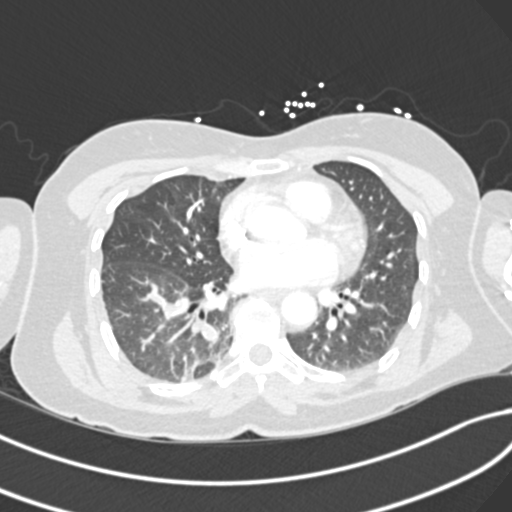
[im 134/267  soft-tissue]
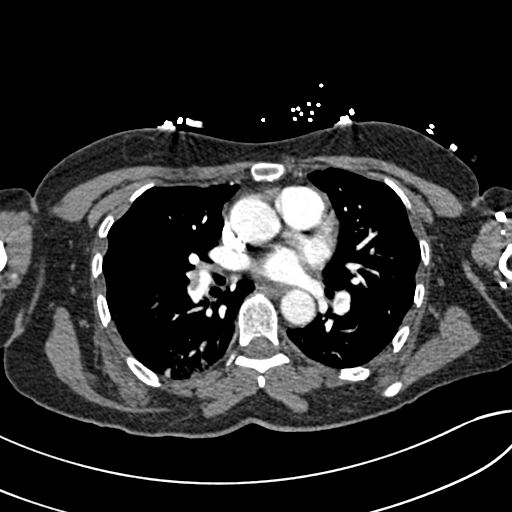
[im 150/267  lung]
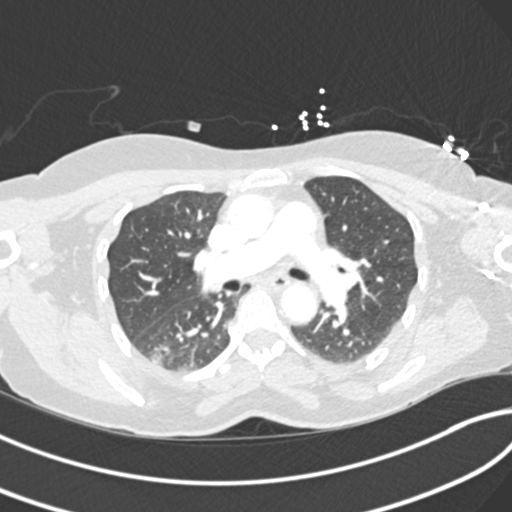
[im 167/267  soft-tissue]
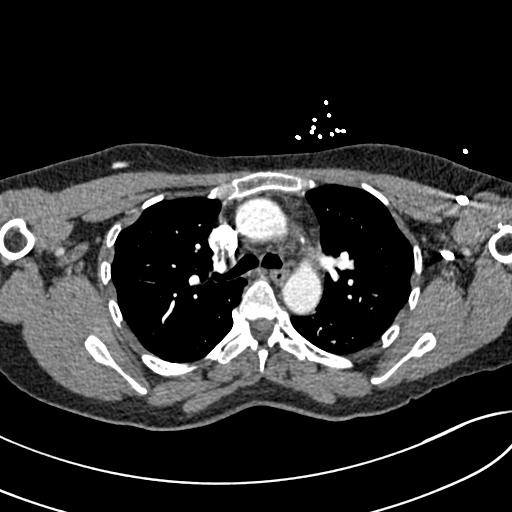
[im 183/267  lung]
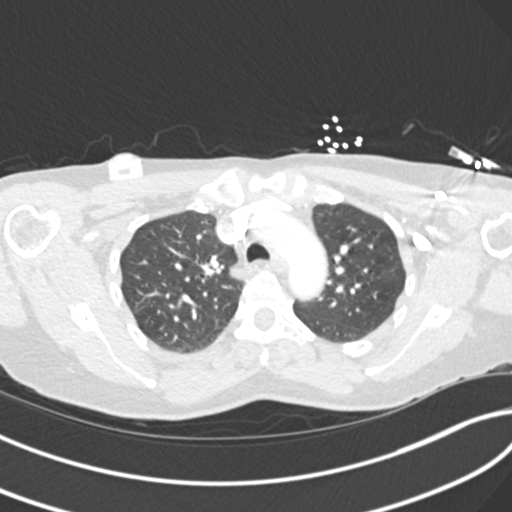
[im 200/267  soft-tissue]
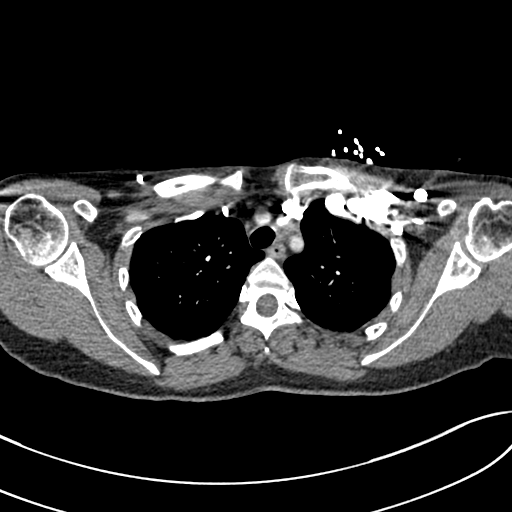
[im 217/267  lung]
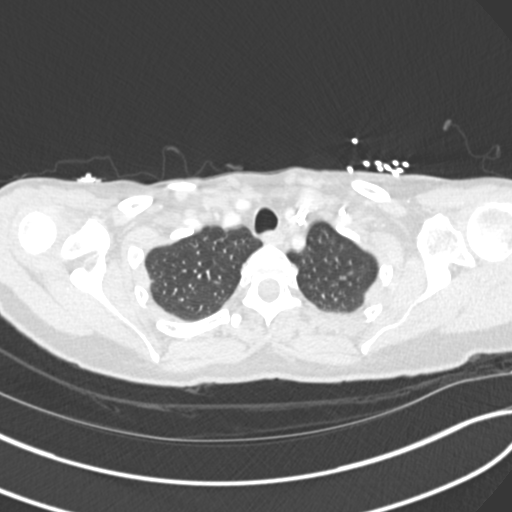
[im 233/267  soft-tissue]
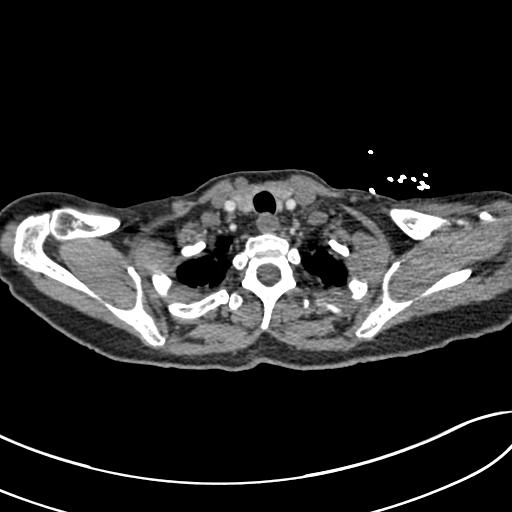
[im 250/267  lung]
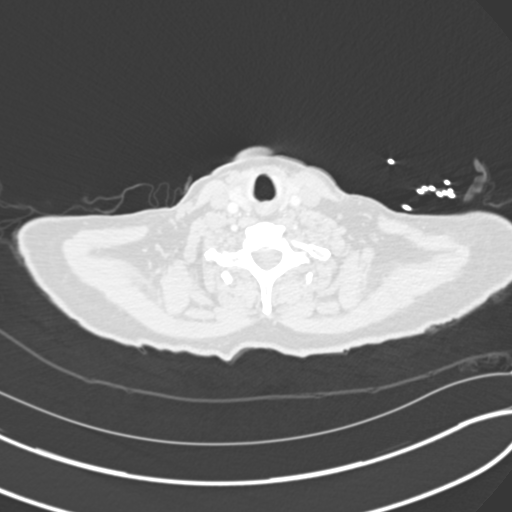

[Series 8: coronal mpr · coronal · 0.54mm/px · 2 of 75 slices shown]
[im 25/75  soft-tissue]
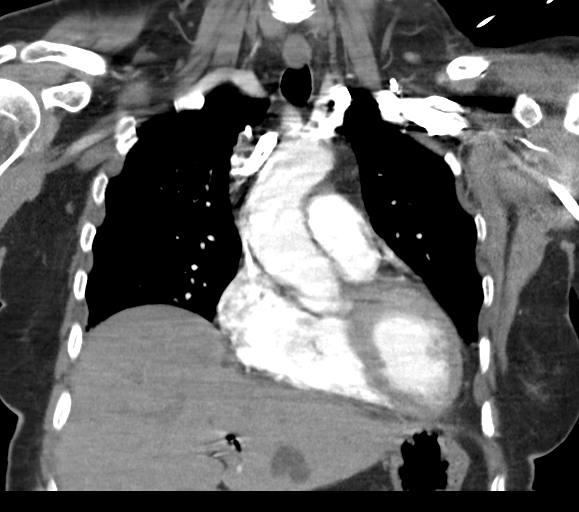
[im 50/75  soft-tissue]
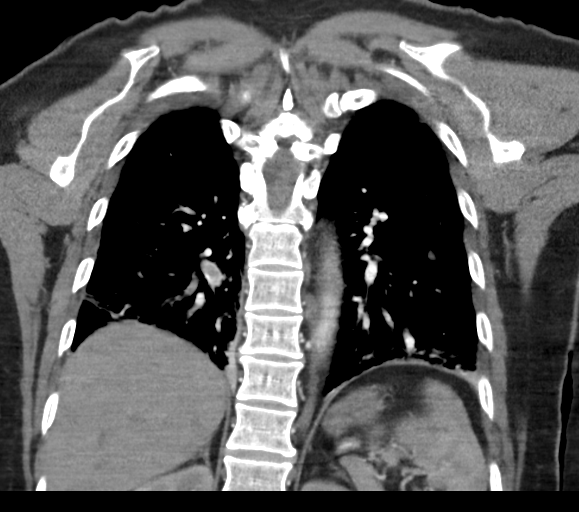

[17 of 46 positions shown; findings below may reference images not displayed]

RADIATION DOSE REDUCTION: This exam was performed according to the
departmental dose-optimization program which includes automated
exposure control, adjustment of the mA and/or kV according to
patient size and/or use of iterative reconstruction technique.

CONTRAST:  80mL OMNIPAQUE IOHEXOL 350 MG/ML SOLN
FINDINGS: Cardiovascular: The heart size is normal. No substantial pericardial
effusion. No thoracic aortic aneurysm. Right Port-A-Cath tip is
positioned in the distal SVC. Acute pulmonary embolus identified in
the right middle lobe pulmonary artery and segmental branches to the
right lower lobe. Nonocclusive thrombus identified in proximal
segmental branches to the left lower lobe. RV/LV ratio is 0.78.

Mediastinum/Nodes: No mediastinal lymphadenopathy. There is no hilar
lymphadenopathy. The esophagus has normal imaging features. There is
no axillary lymphadenopathy.

Lungs/Pleura: Atelectasis noted in the lung bases. Component of
pulmonary infarct in the right lower lobe not excluded. 8 mm
perifissural nodule noted left lower lobe on image 71/7. 4 mm right
upper lobe nodule identified on 35/7. No substantial pleural
effusion.

Upper Abdomen: Pneumobilia has been incompletely visualized in this
patient with a history choledocho jejunostomy. Hypoattenuating
lesions in the liver measure up to 2.5 cm in the left hepatic lobe,
potentially cysts.

Musculoskeletal: No worrisome lytic or sclerotic osseous
abnormality.

Review of the MIP images confirms the above findings.
IMPRESSION: 1. Acute pulmonary embolus in the right middle lobe pulmonary artery
and segmental branches to the right and left lower lobes. No
evidence for right heart strain. RV/LV ratio is 0.78.
2. Atelectasis in the lung bases. Component of pulmonary infarct in
the right lower lobe not excluded.
3. Small bilateral pulmonary nodules measuring up to 8 mm.

Critical Value/emergent results were called by telephone at the time
of interpretation on 01/07/2022 at [DATE] to provider ISABELITA BECHTEL
, who verbally acknowledged these results.

## 2023-09-19 ENCOUNTER — Other Ambulatory Visit: Payer: Self-pay | Admitting: Hematology and Oncology

## 2024-09-08 ENCOUNTER — Other Ambulatory Visit: Payer: Self-pay
